# Patient Record
Sex: Female | Born: 1985
Health system: Southern US, Community
[De-identification: ages and names within clinical notes are randomized; demographics above are authoritative.]

## PROBLEM LIST (undated history)

## (undated) ENCOUNTER — Inpatient Hospital Stay (HOSPITAL_COMMUNITY): Payer: Medicaid Other | Admitting: Obstetrics & Gynecology

## (undated) DIAGNOSIS — K589 Irritable bowel syndrome without diarrhea: Secondary | ICD-10-CM

## (undated) DIAGNOSIS — Z531 Procedure and treatment not carried out because of patient's decision for reasons of belief and group pressure: Secondary | ICD-10-CM

## (undated) DIAGNOSIS — IMO0001 Reserved for inherently not codable concepts without codable children: Secondary | ICD-10-CM

## (undated) DIAGNOSIS — L732 Hidradenitis suppurativa: Secondary | ICD-10-CM

## (undated) DIAGNOSIS — T4145XA Adverse effect of unspecified anesthetic, initial encounter: Secondary | ICD-10-CM

## (undated) DIAGNOSIS — F329 Major depressive disorder, single episode, unspecified: Secondary | ICD-10-CM

## (undated) DIAGNOSIS — F419 Anxiety disorder, unspecified: Secondary | ICD-10-CM

## (undated) DIAGNOSIS — F32A Depression, unspecified: Secondary | ICD-10-CM

## (undated) DIAGNOSIS — L0232 Furuncle of buttock: Secondary | ICD-10-CM

## (undated) DIAGNOSIS — R112 Nausea with vomiting, unspecified: Secondary | ICD-10-CM

## (undated) DIAGNOSIS — D649 Anemia, unspecified: Secondary | ICD-10-CM

## (undated) DIAGNOSIS — Z9889 Other specified postprocedural states: Secondary | ICD-10-CM

## (undated) DIAGNOSIS — T8859XA Other complications of anesthesia, initial encounter: Secondary | ICD-10-CM

## (undated) HISTORY — DX: Hidradenitis suppurativa: L73.2

## (undated) HISTORY — PX: OTHER SURGICAL HISTORY: SHX169

## (undated) HISTORY — DX: Anemia, unspecified: D64.9

## (undated) HISTORY — PX: NASAL ENDOSCOPY: SHX286

## (undated) SURGERY — INCISION AND DRAINAGE, ABSCESS
Anesthesia: General | Laterality: Bilateral

---

## 2004-06-03 ENCOUNTER — Inpatient Hospital Stay (HOSPITAL_COMMUNITY): Admission: AD | Admit: 2004-06-03 | Discharge: 2004-06-03 | Payer: Self-pay | Admitting: Obstetrics and Gynecology

## 2004-06-05 ENCOUNTER — Inpatient Hospital Stay (HOSPITAL_COMMUNITY): Admission: AD | Admit: 2004-06-05 | Discharge: 2004-06-05 | Payer: Self-pay | Admitting: Obstetrics and Gynecology

## 2004-06-27 ENCOUNTER — Inpatient Hospital Stay (HOSPITAL_COMMUNITY): Admission: AD | Admit: 2004-06-27 | Discharge: 2004-06-28 | Payer: Self-pay | Admitting: Obstetrics and Gynecology

## 2004-06-28 ENCOUNTER — Inpatient Hospital Stay (HOSPITAL_COMMUNITY): Admission: AD | Admit: 2004-06-28 | Discharge: 2004-07-01 | Payer: Self-pay | Admitting: Obstetrics and Gynecology

## 2004-08-11 ENCOUNTER — Other Ambulatory Visit: Admission: RE | Admit: 2004-08-11 | Discharge: 2004-08-11 | Payer: Self-pay | Admitting: Obstetrics and Gynecology

## 2004-08-12 ENCOUNTER — Encounter: Admission: RE | Admit: 2004-08-12 | Discharge: 2004-08-12 | Payer: Self-pay | Admitting: Obstetrics and Gynecology

## 2004-09-04 ENCOUNTER — Encounter: Admission: RE | Admit: 2004-09-04 | Discharge: 2004-09-04 | Payer: Self-pay | Admitting: Obstetrics and Gynecology

## 2004-09-18 ENCOUNTER — Encounter: Admission: RE | Admit: 2004-09-18 | Discharge: 2004-09-18 | Payer: Self-pay | Admitting: Obstetrics and Gynecology

## 2005-05-29 ENCOUNTER — Other Ambulatory Visit: Admission: RE | Admit: 2005-05-29 | Discharge: 2005-05-29 | Payer: Self-pay | Admitting: Obstetrics and Gynecology

## 2009-06-15 HISTORY — PX: WISDOM TOOTH EXTRACTION: SHX21

## 2009-11-29 ENCOUNTER — Ambulatory Visit: Payer: Self-pay | Admitting: Diagnostic Radiology

## 2009-11-29 ENCOUNTER — Emergency Department (HOSPITAL_BASED_OUTPATIENT_CLINIC_OR_DEPARTMENT_OTHER): Admission: EM | Admit: 2009-11-29 | Discharge: 2009-11-29 | Payer: Self-pay | Admitting: Emergency Medicine

## 2009-12-01 ENCOUNTER — Ambulatory Visit: Payer: Self-pay | Admitting: Nurse Practitioner

## 2009-12-01 ENCOUNTER — Inpatient Hospital Stay (HOSPITAL_COMMUNITY): Admission: AD | Admit: 2009-12-01 | Discharge: 2009-12-02 | Payer: Self-pay | Admitting: Obstetrics & Gynecology

## 2009-12-22 ENCOUNTER — Emergency Department (HOSPITAL_BASED_OUTPATIENT_CLINIC_OR_DEPARTMENT_OTHER): Admission: EM | Admit: 2009-12-22 | Discharge: 2009-12-22 | Payer: Self-pay | Admitting: Emergency Medicine

## 2010-08-31 LAB — CBC
HCT: 40.9 % (ref 36.0–46.0)
MCV: 96.3 fL (ref 78.0–100.0)
Platelets: 239 10*3/uL (ref 150–400)
RDW: 14 % (ref 11.5–15.5)

## 2010-08-31 LAB — URINALYSIS, ROUTINE W REFLEX MICROSCOPIC
Bilirubin Urine: NEGATIVE
Ketones, ur: NEGATIVE mg/dL
Protein, ur: NEGATIVE mg/dL
Specific Gravity, Urine: 1.018 (ref 1.005–1.030)
Urobilinogen, UA: 1 mg/dL (ref 0.0–1.0)
pH: 7 (ref 5.0–8.0)

## 2010-08-31 LAB — GC/CHLAMYDIA PROBE AMP, GENITAL: GC Probe Amp, Genital: NEGATIVE

## 2010-08-31 LAB — WET PREP, GENITAL: Clue Cells Wet Prep HPF POC: NONE SEEN

## 2010-08-31 LAB — HCG, QUANTITATIVE, PREGNANCY: hCG, Beta Chain, Quant, S: 31 m[IU]/mL — ABNORMAL HIGH (ref <5)

## 2010-08-31 LAB — URINE MICROSCOPIC-ADD ON

## 2010-10-31 NOTE — Discharge Summary (Signed)
Lisa Guerrero, Lisa Guerrero           ACCOUNT NO.:  [id number]   MEDICAL RECORD NO.:  [id number]          PATIENT TYPE:  INP   LOCATION:  9124                          FACILITY:  [facility]   PHYSICIAN:  Zenaida Niece, M.D.DATE OF BIRTH:  1986-02-19   DATE OF ADMISSION:  06/28/2004  DATE OF DISCHARGE:  07/01/2004                                 DISCHARGE SUMMARY   ADMISSION DIAGNOSIS:  Intrauterine pregnancy at 38 weeks with rupture of  membranes and Group B Streptococcus carrier.   DISCHARGE DIAGNOSES:  1.  Intrauterine pregnancy at 38 weeks with rupture of membranes and Group B      Streptococcus carrier.  2.  Prolonged rupture of membranes.   PROCEDURE:  On January 15, she had a vacuum-assisted vaginal delivery.   HISTORY OF PRESENT ILLNESS:  This is a 25 year old, African-American female,  G1, P0 with an EGA of [redacted] weeks who presents with complaints of spontaneous  rupture of membranes at approximately 0330 hours on January 14, with clear  fluid.  She had occasional contractions, no bleeding and slightly decreased  fetal movement.  She had a reactive nonstress test on the evening of January  13, for decreased fetal movement and on evaluation at that time, she was 3  cm dilated.  On admission currently, rupture of membranes were confirmed and  she was still 3 cm dilated with no change from the previous night.   PRENATAL COURSE:  She transferred to our group at approximately 32 weeks and  care was essentially uncomplicated.   PRENATAL LABORATORY DATA:  Blood type O positive with a negative antibody  screen.  RPR nonreactive.  Rubella immune.  Hepatitis B surface antigen.  HIV negative.  Gonorrhea and Chlamydia negative.  Triple screen normal.  One-  hour Glucola 139 and Group B Streptococcus is positive.   PAST GYNECOLOGIC HISTORY:  Significant for history of Chlamydia in March  2005, treated with negative test of cure.   PHYSICAL EXAMINATION:  VITAL SIGNS:  Afebrile with stable  vital signs.  Fetal heart tracing was reactive with contractions every 4-5 minutes on  Pitocin.  ABDOMEN:  Gravid, nontender with an estimated fetal weight of 7.5 pounds.  PELVIC:  Cervix was 3, 60, -1 to -2 with a vertex presentation and adequate  pelvis.   HOSPITAL COURSE:  The patient was admitted and started on Pitocin for  augmentation and penicillin for Group B Streptococcus prophylaxis.  She had  a very prolonged, latent labor.  She did not enter active labor until the  morning of January 15.  She then gradually progressed to complete and pushed  well.  On the afternoon of January 15, she had a vacuum-assisted vaginal  delivery for decreased expulsive efforts.  She eventually actually pushed  the vertex out by herself and had a vaginal delivery of a viable female infant  with Apgar's of 7 and 9 that weighed 7 pounds 3 ounces.  The placenta  delivered spontaneous and was intact.  She had two superficial labial  abrasions which were hemostatic and not repaired.  Fundus was firm with  massage and she was given  IM Methergine due to prolonged Pitocin exposure.  Estimated blood loss was 500 cc.  The patient did have a temperature to  100.8 just prior to delivery, but this was observed.  Postpartum, she had no  significant complications.  She had normal bleeding and remained afebrile.  Predelivery hemoglobin was 12.2, postdelivery 11.0.  On postpartum day #2,  she was felt to be stable enough for discharge home.   DIET:  Regular diet.   ACTIVITY:  Pelvic rest.   FOLLOW UP:  Follow up in 6 weeks.   DISCHARGE MEDICATIONS:  1.  Over-the-counter ibuprofen as needed.  2.  Percocet #30 one to two p.o. q.4-6h. p.r.n. pain.   SPECIAL INSTRUCTIONS:  She was given our discharge pamphlet.      TDM/MEDQ  D:  07/01/2004  T:  07/01/2004  Job:  [id number]

## 2011-02-08 ENCOUNTER — Emergency Department (HOSPITAL_BASED_OUTPATIENT_CLINIC_OR_DEPARTMENT_OTHER)
Admission: EM | Admit: 2011-02-08 | Discharge: 2011-02-08 | Disposition: A | Discharge status: Home / Self Care | Payer: Medicaid Other | Attending: Emergency Medicine | Admitting: Emergency Medicine

## 2011-02-08 ENCOUNTER — Encounter: Payer: Self-pay | Admitting: Emergency Medicine

## 2011-02-08 DIAGNOSIS — N760 Acute vaginitis: Secondary | ICD-10-CM | POA: Insufficient documentation

## 2011-02-08 DIAGNOSIS — A499 Bacterial infection, unspecified: Secondary | ICD-10-CM | POA: Insufficient documentation

## 2011-02-08 DIAGNOSIS — B9689 Other specified bacterial agents as the cause of diseases classified elsewhere: Secondary | ICD-10-CM | POA: Insufficient documentation

## 2011-02-08 LAB — DIFFERENTIAL
Eosinophils Absolute: 0.1 10*3/uL (ref 0.0–0.7)
Eosinophils Relative: 1 % (ref 0–5)
Lymphocytes Relative: 37 % (ref 12–46)
Lymphs Abs: 2.1 10*3/uL (ref 0.7–4.0)
Monocytes Relative: 8 % (ref 3–12)
Neutro Abs: 3 10*3/uL (ref 1.7–7.7)
Neutrophils Relative %: 54 % (ref 43–77)

## 2011-02-08 LAB — URINALYSIS, ROUTINE W REFLEX MICROSCOPIC
Bilirubin Urine: NEGATIVE
Ketones, ur: NEGATIVE mg/dL
Leukocytes, UA: NEGATIVE
Protein, ur: NEGATIVE mg/dL
pH: 7 (ref 5.0–8.0)

## 2011-02-08 LAB — CBC
MCHC: 33.5 g/dL (ref 30.0–36.0)
Platelets: 220 10*3/uL (ref 150–400)

## 2011-02-08 LAB — BASIC METABOLIC PANEL
BUN: 5 mg/dL — ABNORMAL LOW (ref 6–23)
Calcium: 9.8 mg/dL (ref 8.4–10.5)
Creatinine, Ser: 0.6 mg/dL (ref 0.50–1.10)
GFR calc non Af Amer: >60 mL/min (ref >60)

## 2011-02-08 LAB — URINE MICROSCOPIC-ADD ON

## 2011-02-08 MED ORDER — HYDROCODONE-ACETAMINOPHEN 5-325 MG PO TABS
1.0000 | ORAL_TABLET | Freq: Once | ORAL | Status: AC
  Administered 2011-02-08: 1 via ORAL
  Filled 2011-02-08: qty 1

## 2011-02-08 MED ORDER — METRONIDAZOLE 500 MG PO TABS: 500.0000 mg | ORAL_TABLET | Freq: Two times a day (BID) | ORAL | Status: AC

## 2011-02-08 MED ORDER — HYDROCODONE-ACETAMINOPHEN 5-325 MG PO TABS: 1.0000 | ORAL_TABLET | ORAL | Status: AC | PRN

## 2011-02-08 NOTE — ED Notes (Signed)
Patient is resting comfortably.  Pt reports having a moderate amount of pain after pelvic.   Pt states she is pleased with the care she is receiving from Dr. Ignacia Palma.

## 2011-02-08 NOTE — ED Notes (Signed)
Pt reports BLQ pain, describes as cramping, since last pm. Also reports passing "tiny clots"

## 2011-02-08 NOTE — ED Notes (Signed)
Pt states she is having "contraction-like" pelvic pain.  Pt reports having vaginal spotting yesterday and blood in her urine today.  Pt states LMP was 01/16/11 and reports that she is not currently sexually active.  MD at bedside.

## 2011-02-08 NOTE — ED Provider Notes (Signed)
History     CSN: [id number] Arrival date & time: 02/08/2011  3:03 PM Scribed for Lisa Cooper III, MD, the patient was seen in room MH09/MH09. This chart was scribed by Katha Cabal. This patient's care was started at  3:36PM.   Chief Complaint  Patient presents with  . Abdominal Pain   HPI Lisa Guerrero is a 25 y.o. female who presents to the Emergency Department complaining of bilateral suprapubic abdominal pain described as "bad cramping" that waxes and wanes onset last night with associated vaginal bleeding with small clots.  Pt states pain is gradually getting worse, rated 10/10 at worse and 7/10 currently.  LKMP 8/3//2012, typically lasts for 4 days.  No relief from Ibuprofen.  Denies hx of DM, HTN.  Notes similar sx previously when she was younger and was given Naproxen given for menstrual cramps.     HPI ELEMENTS:  Location: suprapubic  Onset: last night Duration: gradually getting worse  Timing: waxing and waning Quality: "cramping" Severity: 10/10 at worse, currently 6/10 Modifying factors: No relief fromIbuprofen. Context: as above  Associated symptoms: vaginal bleeding   PAST MEDICAL HISTORY:  History reviewed. No pertinent past medical history.  PAST SURGICAL HISTORY:  Right axilla cyst removal.    MEDICATIONS:  Previous Medications   IBUPROFEN (ADVIL,MOTRIN) 200 MG TABLET    Take 200-800 mg by mouth every 6 (six) hours as needed. pain    NAPHAZOLINE (CLEAR EYES) 0.012 % OPHTHALMIC SOLUTION    Place 1 drop into both eyes 2 (two) times daily.       ALLERGIES:  Allergies as of 02/08/2011  . (No Known Allergies)     FAMILY HISTORY:  History reviewed. No pertinent family history.   SOCIAL HISTORY: History   Social History  . Marital Status: Single    Spouse Name: N/A    Number of Children: 2  . Years of Education: N/A   Social History Main Topics  . Smoking status: Current Everyday Smoker, 4-5 cigarettes a day  . Smokeless tobacco: None  .  Alcohol Use: Yes     social  . Drug Use: No  . Sexually Active: Not Currently    Birth Control/ Protection: None   Other Topics Concern  . None   Social History Narrative  . None    Review of Systems 10 Systems reviewed and are negative for acute change except as noted in the HPI.  Physical Exam  BP 105/72  Pulse 69  Temp(Src) 98.3 F (36.8 C) (Oral)  Resp 16  SpO2 100%  LMP 01/16/2011  Physical Exam  Nursing note and vitals reviewed. Constitutional: She is oriented to person, place, and time. She appears well-developed and well-nourished.  HENT:  Head: Normocephalic and atraumatic.  Right Ear: Tympanic membrane normal.  Left Ear: Tympanic membrane normal.  Mouth/Throat: Oropharynx is clear and moist.  Eyes: EOM are normal. Pupils are equal, round, and reactive to light.  Neck: Neck supple.  Cardiovascular: Normal rate, regular rhythm and normal heart sounds.   No murmur heard. Pulmonary/Chest: Effort normal and breath sounds normal. She has no wheezes.  Abdominal: Soft. Bowel sounds are normal. There is no tenderness.  Genitourinary:       Patient has dark red blood in the vagina. The uterus is small and nontender there is no adnexal mass or tenderness GC chlamydia and wet prep specimens were obtained.  Musculoskeletal: Normal range of motion. She exhibits no tenderness (no tenderness with palpation).  Neurological: She is alert and  oriented to person, place, and time.  Skin: Skin is warm and dry.  Psychiatric: She has a normal mood and affect. Her behavior is normal.    ED Course  Procedures  OTHER DATA REVIEWED: Nursing notes, vital signs, and past medical records reviewed.    DIAGNOSTIC STUDIES: Oxygen Saturation is 100% on room air, normal by my interpretation.     LABS / RADIOLOGY: Results for orders placed during the hospital encounter of 02/08/11  URINALYSIS, ROUTINE W REFLEX MICROSCOPIC      Component Value Range   Color, Urine YELLOW  YELLOW     Appearance CLOUDY (*) CLEAR    Specific Gravity, Urine 1.019  1.005 - 1.030    pH 7.0  5.0 - 8.0    Glucose, UA NEGATIVE  NEGATIVE (mg/dL)   Hgb urine dipstick LARGE (*) NEGATIVE    Bilirubin Urine NEGATIVE  NEGATIVE    Ketones, ur NEGATIVE  NEGATIVE (mg/dL)   Protein, ur NEGATIVE  NEGATIVE (mg/dL)   Urobilinogen, UA 0.2  0.0 - 1.0 (mg/dL)   Nitrite NEGATIVE  NEGATIVE    Leukocytes, UA NEGATIVE  NEGATIVE   CBC      Component Value Range   WBC 5.6  4.0 - 10.5 (K/uL)   RBC 4.48  3.87 - 5.11 (MIL/uL)   Hemoglobin 13.8  12.0 - 15.0 (g/dL)   HCT 14.7  82.9 - 56.2 (%)   MCV 92.0  78.0 - 100.0 (fL)   MCH 30.8  26.0 - 34.0 (pg)   MCHC 33.5  30.0 - 36.0 (g/dL)   RDW 13.0  86.5 - 78.4 (%)   Platelets 220  150 - 400 (K/uL)  DIFFERENTIAL      Component Value Range   Neutrophils Relative PENDING  43 - 77 (%)   Neutro Abs PENDING  1.7 - 7.7 (K/uL)   Band Neutrophils PENDING  0 - 10 (%)   Lymphocytes Relative PENDING  12 - 46 (%)   Lymphs Abs PENDING  0.7 - 4.0 (K/uL)   Monocytes Relative PENDING  3 - 12 (%)   Monocytes Absolute PENDING  0.1 - 1.0 (K/uL)   Eosinophils Relative PENDING  0 - 5 (%)   Eosinophils Absolute PENDING  0.0 - 0.7 (K/uL)   Basophils Relative PENDING  0 - 1 (%)   Basophils Absolute PENDING  0.0 - 0.1 (K/uL)   WBC Morphology PENDING     RBC Morphology PENDING     Smear Review PENDING     nRBC PENDING  0 (/100 WBC)   Metamyelocytes Relative PENDING     Myelocytes PENDING     Promyelocytes Absolute PENDING     Blasts PENDING    BASIC METABOLIC PANEL      Component Value Range   Sodium 138  135 - 145 (mEq/L)   Potassium 3.9  3.5 - 5.1 (mEq/L)   Chloride 104  96 - 112 (mEq/L)   CO2 26  19 - 32 (mEq/L)   Glucose, Bld 99  70 - 99 (mg/dL)   BUN 5 (*) 6 - 23 (mg/dL)   Creatinine, Ser 6.96  0.50 - 1.10 (mg/dL)   Calcium 9.8  8.4 - 29.5 (mg/dL)   GFR calc non Af Amer >60  >60 (mL/min)   GFR calc Af Amer >60  >60 (mL/min)  WET PREP, GENITAL      Component Value  Range   Yeast, Wet Prep NONE SEEN  NONE SEEN    Trich, Wet Prep NONE SEEN  NONE SEEN    Clue Cells, Wet Prep FEW (*) NONE SEEN    WBC, Wet Prep HPF POC MODERATE (*) NONE SEEN   PREGNANCY, URINE      Component Value Range   Preg Test, Ur NEGATIVE    URINE MICROSCOPIC-ADD ON      Component Value Range   Squamous Epithelial / LPF MANY (*) RARE    WBC, UA 0-2  <3 (WBC/hpf)   RBC / HPF 0-2  <3 (RBC/hpf)   Bacteria, UA MANY (*) RARE    Urine-Other MUCOUS PRESENT       No results found.   ED COURSE / COORDINATION OF CARE: Orders Placed This Encounter  Procedures  . Wet prep, genital  . Urinalysis with microscopic  . CBC  . Differential  . Basic metabolic panel  . GC/chlamydia probe amp, genital  . Pregnancy, urine  . Urine microscopic-add on  . Pelvic cart    MDM:  3:45 PE complete, order urine and blood tests.  Pain Control.   5:43 PM UA negative.  Wet prep positive for clue cells.  UPG negative.  Waiting for CBC and BMET results.    DDx  Appendicitis, Pregnancy, Ovarian Cyst, Infection   IMPRESSION: Diagnoses that have been ruled out:  Diagnoses that are still under consideration:  Final diagnoses:    PLAN:   The patient is to return the emergency department if there is any worsening of symptoms. I have reviewed the discharge instructions with the parent.     CONDITION ON DISCHARGE: Good   MEDICATIONS GIVEN IN THE E.D.  Medications  HYDROcodone-acetaminophen (NORCO) 5-325 MG per tablet 1 tablet           DISCHARGE MEDICATIONS: New Prescriptions   No medications on file   Lab tests suggest bacterial vaginosis. Prescription for metronidazole 500 mg twice a day x7 days. She was also prescribed hydrocodone acetaminophen every 4 hours when necessary pain. Her pregnancy test was negative and the patient was informed of this.  I personally performed the services described in this documentation, which was scribed in my presence. The recorded information  has been reviewed and considered. Osvaldo Human, MD    Lisa Cooper III, MD 02/08/11 [phone]

## 2011-02-08 NOTE — ED Notes (Signed)
Patient is resting comfortably.MD at side care plan reviewed

## 2011-02-08 NOTE — ED Notes (Signed)
Pt verbalizes care plan and use of anitibiotics and pain medication

## 2011-02-09 LAB — GC/CHLAMYDIA PROBE AMP, GENITAL
Chlamydia, DNA Probe: NEGATIVE
GC Probe Amp, Genital: NEGATIVE

## 2011-05-03 ENCOUNTER — Encounter (HOSPITAL_BASED_OUTPATIENT_CLINIC_OR_DEPARTMENT_OTHER): Payer: Self-pay | Admitting: *Deleted

## 2011-05-03 ENCOUNTER — Emergency Department (HOSPITAL_BASED_OUTPATIENT_CLINIC_OR_DEPARTMENT_OTHER)
Admission: EM | Admit: 2011-05-03 | Discharge: 2011-05-03 | Discharge status: Against Medical Advice | Payer: Medicaid Other | Attending: Emergency Medicine | Admitting: Emergency Medicine

## 2011-05-03 DIAGNOSIS — K920 Hematemesis: Secondary | ICD-10-CM | POA: Insufficient documentation

## 2011-05-03 NOTE — ED Notes (Signed)
Pt states she woke up this am and was vomiting blood. Went to [facility]. Labs done. Pt given fluids and CXR was done "Didn't find anything". Pt thinks she has an ulcer and wants a second opinion. Now c/o low abd pain. No vomiting since leaving the hospital.

## 2011-07-23 ENCOUNTER — Emergency Department (HOSPITAL_BASED_OUTPATIENT_CLINIC_OR_DEPARTMENT_OTHER)
Admission: EM | Admit: 2011-07-23 | Discharge: 2011-07-23 | Disposition: A | Discharge status: Home / Self Care | Payer: Medicaid Other | Attending: Emergency Medicine | Admitting: Emergency Medicine

## 2011-07-23 ENCOUNTER — Encounter (INDEPENDENT_AMBULATORY_CARE_PROVIDER_SITE_OTHER): Payer: Self-pay | Admitting: General Surgery

## 2011-07-23 ENCOUNTER — Encounter (HOSPITAL_BASED_OUTPATIENT_CLINIC_OR_DEPARTMENT_OTHER): Payer: Self-pay | Admitting: *Deleted

## 2011-07-23 DIAGNOSIS — F17200 Nicotine dependence, unspecified, uncomplicated: Secondary | ICD-10-CM | POA: Insufficient documentation

## 2011-07-23 DIAGNOSIS — L0291 Cutaneous abscess, unspecified: Secondary | ICD-10-CM

## 2011-07-23 DIAGNOSIS — IMO0002 Reserved for concepts with insufficient information to code with codable children: Secondary | ICD-10-CM | POA: Insufficient documentation

## 2011-07-23 MED ORDER — LIDOCAINE-EPINEPHRINE 2 %-1:100000 IJ SOLN
20.0000 mL | Freq: Once | INTRAMUSCULAR | Status: AC
  Administered 2011-07-23: 1 mL via INTRADERMAL
  Filled 2011-07-23: qty 1

## 2011-07-23 MED ORDER — OXYCODONE-ACETAMINOPHEN 5-325 MG PO TABS: 1.0000 | ORAL_TABLET | Freq: Four times a day (QID) | ORAL | Status: AC | PRN

## 2011-07-23 MED ORDER — MORPHINE SULFATE 4 MG/ML IJ SOLN
4.0000 mg | Freq: Once | INTRAMUSCULAR | Status: AC
  Administered 2011-07-23: 4 mg via INTRAVENOUS
  Filled 2011-07-23: qty 1

## 2011-07-23 NOTE — ED Provider Notes (Addendum)
History     CSN: [id number]  Arrival date & time 07/23/11  1041   First MD Initiated Contact with Patient 07/23/11 1050      Chief Complaint  Patient presents with  . Abscess    (Consider location/radiation/quality/duration/timing/severity/associated sxs/prior treatment) Patient is a 26 y.o. female presenting with abscess. The history is provided by the patient.  Abscess  This is a recurrent problem. The current episode started less than one week ago. The onset was gradual. The problem has been gradually worsening. Affected Location: left axilla. The abscess is characterized by redness and painfulness. Pertinent negatives include no fever, no diarrhea and no vomiting.    History reviewed. No pertinent past medical history. except prior abscesses requiring incision and drainage  History reviewed. No pertinent past surgical history.  History reviewed. No pertinent family history.  History  Substance Use Topics  . Smoking status: Current Everyday Smoker  . Smokeless tobacco: Not on file  . Alcohol Use: Yes     social    OB History    Grav Para Term Preterm Abortions TAB SAB Ect Mult Living                  Review of Systems  Constitutional: Negative for fever.  Gastrointestinal: Negative for vomiting and diarrhea.  All other systems reviewed and are negative.    Allergies  Review of patient's allergies indicates no known allergies.  Home Medications   Current Outpatient Rx  Name Route Sig Dispense Refill  . DOXYCYCLINE HYCLATE 100 MG PO CPEP Oral Take 100 mg by mouth 2 (two) times daily.      Marland Kitchen MEDROXYPROGESTERONE ACETATE 150 MG/ML IM SUSP Intramuscular Inject 150 mg into the muscle every 3 (three) months.      Marland Kitchen METRONIDAZOLE 500 MG PO TABS Oral Take 500 mg by mouth 2 (two) times daily.        BP 134/77  Pulse 107  Temp(Src) 97.7 F (36.5 C) (Oral)  Resp 20  Ht 5\' 5"  (1.651 m)  Wt 146 lb (66.225 kg)  BMI 24.30 kg/m2  SpO2 100%  LMP  06/26/2011  Physical Exam  Nursing note and vitals reviewed. Constitutional: She appears well-developed and well-nourished. No distress.  HENT:  Head: Normocephalic and atraumatic.  Right Ear: External ear normal.  Left Ear: External ear normal.  Eyes: Conjunctivae are normal. Right eye exhibits no discharge. Left eye exhibits no discharge. No scleral icterus.  Neck: Neck supple. No tracheal deviation present.  Cardiovascular: Normal rate.   Pulmonary/Chest: Effort normal. No stridor. No respiratory distress.  Musculoskeletal: She exhibits no edema.       Left axillary region with erythema, fluctuance and induration, well healed surgical scars from prior treatments  Neurological: She is alert. Cranial nerve deficit: no gross deficits.  Skin: Skin is warm and dry. No rash noted.  Psychiatric: She has a normal mood and affect.    ED Course  INCISION AND DRAINAGE Performed by: Linwood Dibbles R Authorized by: Linwood Dibbles R Consent: Verbal consent obtained. Written consent not obtained. Risks and benefits: risks, benefits and alternatives were discussed Consent given by: patient Time out: Immediately prior to procedure a "time out" was called to verify the correct patient, procedure, equipment, support staff and site/side marked as required. Type: abscess Body area: upper extremity (left axilla) Anesthesia: local infiltration Local anesthetic: lidocaine 2% with epinephrine Anesthetic total: 10 ml Patient sedated: no Risk factor: underlying major vessel and underlying major nerve Scalpel size: 11 Incision  type: single straight Complexity: complex Drainage: purulent Drainage amount: moderate Wound treatment: wound left open Packing material: 1/4 in iodoform gauze Patient tolerance: Patient tolerated the procedure well with no immediate complications.   (including critical care time)  Medications  morphine 4 MG/ML injection 4 mg (4 mg Intravenous Given 07/23/11 1122)   lidocaine-EPINEPHrine (XYLOCAINE W/EPI) 2 %-1:100000 (with pres) injection 20 mL (1 mL Intradermal Given 07/23/11 1121)    Labs Reviewed - No data to display No results found.    MDM  The procedure was complicated by the fact the patient has had multiple incision and drainage procedures with scar tissue.  The abscess pocket was located.  Irrigated and probed.  She most likely has hidradenitis suppurativa.  I will refer  her to general surgery for outpatient followup.    Cases discussed with Dr Harlon Flor in order to arrange close follow up.    Celene Kras, MD 07/23/11 1214  Celene Kras, MD 07/23/11 1226

## 2011-07-23 NOTE — ED Notes (Signed)
Pt amb to room 4 with quick steady gait in nad. Pt reports abcess to left axilla x Sunday, denies any fevers or other c/o, pt states she has hx of the same.

## 2011-07-24 ENCOUNTER — Ambulatory Visit (INDEPENDENT_AMBULATORY_CARE_PROVIDER_SITE_OTHER): Payer: Medicaid Other | Admitting: General Surgery

## 2011-07-24 ENCOUNTER — Encounter (INDEPENDENT_AMBULATORY_CARE_PROVIDER_SITE_OTHER): Payer: Self-pay | Admitting: General Surgery

## 2011-07-24 VITALS — BP 134/80 | HR 100 | Temp 97.8°F | Resp 16 | Ht 65.0 in | Wt 143.2 lb

## 2011-07-24 DIAGNOSIS — L02412 Cutaneous abscess of left axilla: Secondary | ICD-10-CM

## 2011-07-24 DIAGNOSIS — IMO0002 Reserved for concepts with insufficient information to code with codable children: Secondary | ICD-10-CM

## 2011-07-24 NOTE — Progress Notes (Signed)
Patient ID: Lisa Guerrero, female   DOB: 1986/04/14, 26 y.o.   MRN: [id number]  Chief Complaint  Patient presents with  . Abscess    lt axillary abscess    HPI Lisa Guerrero is a 26 y.o. female.  She is referred by Dr. Iantha Fallen, [facility], for evaluation of a left axillary abscess.  The patient states that she has been having bilateral axillary abscess since 2006. Presumably, this is due to hidradenitis suppurativa. When she saw Dr. Lynelle Doctor yesterday, he performed an incision drainage and packed the wound with iodoform gauze. She has a prescription for doxycycline which she states that she has fillled. She was referred here for surgical followup.  She is otherwise fairly healthy and doesn't really have any medical problems. She comes to the office with an infant today which she states is her child. She is in no distress. Temp 97.8. HPI  Past Medical History  Diagnosis Date  . Hidradenitis     CHRONIC  . Anemia     Past Surgical History  Procedure Date  . Drainage axillary abscess     Family History  Problem Relation Age of Onset  . Heart disease Maternal Grandmother     Social History History  Substance Use Topics  . Smoking status: Current Everyday Smoker -- 0.2 packs/day  . Smokeless tobacco: Not on file  . Alcohol Use: Yes     social    No Known Allergies  Current Outpatient Prescriptions  Medication Sig Dispense Refill  . doxycycline (DORYX) 100 MG DR capsule Take 100 mg by mouth 2 (two) times daily.        . metroNIDAZOLE (FLAGYL) 500 MG tablet Take 500 mg by mouth 2 (two) times daily.        Marland Kitchen oxyCODONE-acetaminophen (PERCOCET) 5-325 MG per tablet Take 1-2 tablets by mouth every 6 (six) hours as needed for pain.  16 tablet  0    Review of Systems Review of Systems 12 system review of systems is performed and is negative except as described above. Blood pressure 134/80, pulse 100, temperature 97.8 F (36.6 C), temperature source  Temporal, resp. rate 16, height 5\' 5"  (1.651 m), weight 143 lb 3.2 oz (64.955 kg), last menstrual period 06/26/2011.  Physical Exam Physical Exam  Constitutional: She appears well-developed and well-nourished. No distress.  Musculoskeletal: Normal range of motion. She exhibits no edema and no tenderness.  Neurological: She exhibits normal muscle tone. Coordination normal.  Skin: Skin is warm and dry. No rash noted. She is not diaphoretic. No erythema. No pallor.       There is a very localized abscess in the left axilla with a 5 mm opening with iodoform gauze present. I removed the iodoform gauze. It appeared to be a well-drained unilocular abscess. There is almost no cellulitis but it is very tender. The wound was redressed.  Psychiatric: She has a normal mood and affect. Her behavior is normal. Thought content normal.    Data Reviewed I reviewed the ER note from [facility] to dated July 22, 2010. Assessment    Eft axillary abscess,.  Suspect chronic hidradenitis suppurativa  Current every day smoker.    Plan    Continue doxycycline for 14 days. Shower twice daily and keep dry gauze bandage on Anticipate complete wound healing in 3 weeks. Patient instructed to call if this does not occur. She asked about definitive surgical intervention, and I discussed the complete excision of a pigmented  areas the skin grafts. We refer her to a plastic surgeon in the future if that became necessary   [facility] Derrell Lolling, M.D., [facility] General and Minimally invasive Surgery Breast and Colorectal Surgery Office:   [phone] Pager:   [phone]         Ernestene Mention 07/24/2011, 3:02 PM

## 2011-07-24 NOTE — Patient Instructions (Signed)
The abscess in your left axilla appears to be draining adequately. There does not appear to be any indication for further surgery at this time. I have instructed you to take the doxycycline antibiotic for 14 days. If you run out before then please call us and  we will call him further antibiotics for you.  If this area does not heal in 3 weeks, return to see [place] for further evaluation.  If you want to be evaluated for definitive surgery for your recurrent infections, that will require a referral to a plastic surgeon.

## 2011-11-10 ENCOUNTER — Encounter (HOSPITAL_BASED_OUTPATIENT_CLINIC_OR_DEPARTMENT_OTHER): Payer: Self-pay | Admitting: *Deleted

## 2011-11-10 ENCOUNTER — Emergency Department (HOSPITAL_BASED_OUTPATIENT_CLINIC_OR_DEPARTMENT_OTHER)
Admission: EM | Admit: 2011-11-10 | Discharge: 2011-11-10 | Disposition: A | Discharge status: Home / Self Care | Payer: Medicaid Other | Source: Home / Self Care

## 2011-11-10 DIAGNOSIS — IMO0002 Reserved for concepts with insufficient information to code with codable children: Secondary | ICD-10-CM | POA: Insufficient documentation

## 2011-11-10 HISTORY — DX: Irritable bowel syndrome, unspecified: K58.9

## 2011-11-10 NOTE — ED Notes (Signed)
Abscess in her left axilla x 3 days. It has opened and drained but she is still having pain at the site.

## 2011-11-10 NOTE — ED Notes (Signed)
Pt not visualized by this RN 

## 2011-11-10 NOTE — ED Notes (Signed)
No answer when called in w/a to come back to tx room. She told registration she was leaving and would return tomorrow.

## 2011-11-13 ENCOUNTER — Emergency Department (HOSPITAL_COMMUNITY): Admission: EM | Admit: 2011-11-13 | Payer: Medicaid Other | Source: Home / Self Care

## 2011-11-13 ENCOUNTER — Encounter (HOSPITAL_COMMUNITY): Payer: Self-pay | Admitting: *Deleted

## 2011-11-13 ENCOUNTER — Ambulatory Visit (INDEPENDENT_AMBULATORY_CARE_PROVIDER_SITE_OTHER): Payer: Medicaid Other | Admitting: Surgery

## 2011-11-13 ENCOUNTER — Encounter (INDEPENDENT_AMBULATORY_CARE_PROVIDER_SITE_OTHER): Payer: Self-pay | Admitting: Surgery

## 2011-11-13 ENCOUNTER — Inpatient Hospital Stay (HOSPITAL_COMMUNITY)
Admission: AD | Admit: 2011-11-13 | Discharge: 2011-11-17 | DRG: 603 | Disposition: A | Discharge status: Home / Self Care | Payer: Medicaid Other | Source: Ambulatory Visit | Attending: General Surgery | Admitting: General Surgery

## 2011-11-13 ENCOUNTER — Encounter (INDEPENDENT_AMBULATORY_CARE_PROVIDER_SITE_OTHER): Payer: Medicaid Other | Admitting: Surgery

## 2011-11-13 VITALS — BP 110/70 | HR 68 | Temp 98.0°F | Resp 14 | Ht 65.0 in | Wt 144.4 lb

## 2011-11-13 DIAGNOSIS — L732 Hidradenitis suppurativa: Secondary | ICD-10-CM

## 2011-11-13 DIAGNOSIS — F411 Generalized anxiety disorder: Secondary | ICD-10-CM | POA: Diagnosis not present

## 2011-11-13 DIAGNOSIS — F17200 Nicotine dependence, unspecified, uncomplicated: Secondary | ICD-10-CM | POA: Diagnosis present

## 2011-11-13 DIAGNOSIS — R112 Nausea with vomiting, unspecified: Secondary | ICD-10-CM | POA: Diagnosis not present

## 2011-11-13 DIAGNOSIS — L02419 Cutaneous abscess of limb, unspecified: Secondary | ICD-10-CM | POA: Insufficient documentation

## 2011-11-13 DIAGNOSIS — K589 Irritable bowel syndrome without diarrhea: Secondary | ICD-10-CM | POA: Insufficient documentation

## 2011-11-13 DIAGNOSIS — IMO0002 Reserved for concepts with insufficient information to code with codable children: Secondary | ICD-10-CM

## 2011-11-13 HISTORY — DX: Hidradenitis suppurativa: L73.2

## 2011-11-13 LAB — CBC
MCH: 30.5 pg (ref 26.0–34.0)
MCHC: 32.6 g/dL (ref 30.0–36.0)
Platelets: 304 10*3/uL (ref 150–400)
RBC: 3.94 MIL/uL (ref 3.87–5.11)
RDW: 13.2 % (ref 11.5–15.5)

## 2011-11-13 LAB — CREATININE, SERUM: Creatinine, Ser: 0.72 mg/dL (ref 0.50–1.10)

## 2011-11-13 MED ORDER — ONDANSETRON HCL 4 MG/2ML IJ SOLN
4.0000 mg | Freq: Four times a day (QID) | INTRAMUSCULAR | Status: DC | PRN
  Administered 2011-11-14 – 2011-11-15 (×2): 4 mg via INTRAVENOUS
  Filled 2011-11-13: qty 4
  Filled 2011-11-13 (×2): qty 2

## 2011-11-13 MED ORDER — LACTATED RINGERS IV SOLN
INTRAVENOUS | Status: DC
  Administered 2011-11-13: 22:00:00 via INTRAVENOUS

## 2011-11-13 MED ORDER — DIPHENHYDRAMINE HCL 50 MG/ML IJ SOLN
12.5000 mg | Freq: Four times a day (QID) | INTRAMUSCULAR | Status: DC | PRN
  Administered 2011-11-16: 25 mg via INTRAVENOUS

## 2011-11-13 MED ORDER — MAGNESIUM HYDROXIDE 400 MG/5ML PO SUSP: 30.0000 mL | Freq: Two times a day (BID) | ORAL | Status: DC | PRN

## 2011-11-13 MED ORDER — ACETAMINOPHEN 650 MG RE SUPP: 650.0000 mg | Freq: Four times a day (QID) | RECTAL | Status: DC | PRN

## 2011-11-13 MED ORDER — OXYCODONE HCL 5 MG PO TABS
5.0000 mg | ORAL_TABLET | ORAL | Status: DC | PRN
  Administered 2011-11-15 – 2011-11-16 (×4): 10 mg via ORAL
  Filled 2011-11-13 (×4): qty 2

## 2011-11-13 MED ORDER — DIPHENHYDRAMINE HCL 12.5 MG/5ML PO ELIX: 12.5000 mg | ORAL_SOLUTION | Freq: Four times a day (QID) | ORAL | Status: DC | PRN

## 2011-11-13 MED ORDER — DIPHENHYDRAMINE HCL 50 MG/ML IJ SOLN
12.5000 mg | Freq: Four times a day (QID) | INTRAMUSCULAR | Status: DC | PRN
  Administered 2011-11-13: 12.5 mg via INTRAVENOUS
  Filled 2011-11-13 (×3): qty 1

## 2011-11-13 MED ORDER — CEFAZOLIN SODIUM-DEXTROSE 2-3 GM-% IV SOLR
2.0000 g | Freq: Three times a day (TID) | INTRAVENOUS | Status: DC
  Administered 2011-11-13 – 2011-11-15 (×6): 2 g via INTRAVENOUS
  Filled 2011-11-13 (×7): qty 50

## 2011-11-13 MED ORDER — MAGIC MOUTHWASH
15.0000 mL | Freq: Four times a day (QID) | ORAL | Status: DC | PRN
  Filled 2011-11-13: qty 15

## 2011-11-13 MED ORDER — ALUM & MAG HYDROXIDE-SIMETH 200-200-20 MG/5ML PO SUSP: 30.0000 mL | Freq: Four times a day (QID) | ORAL | Status: DC | PRN

## 2011-11-13 MED ORDER — ACETAMINOPHEN 325 MG PO TABS
650.0000 mg | ORAL_TABLET | Freq: Four times a day (QID) | ORAL | Status: DC | PRN
  Administered 2011-11-16 – 2011-11-17 (×2): 650 mg via ORAL
  Filled 2011-11-13 (×2): qty 2

## 2011-11-13 MED ORDER — NAPROXEN 500 MG PO TABS
500.0000 mg | ORAL_TABLET | Freq: Two times a day (BID) | ORAL | Status: DC
  Filled 2011-11-13 (×3): qty 1

## 2011-11-13 MED ORDER — HYDROMORPHONE HCL PF 1 MG/ML IJ SOLN
0.5000 mg | INTRAMUSCULAR | Status: DC | PRN
  Administered 2011-11-13 – 2011-11-14 (×3): 1 mg via INTRAVENOUS
  Filled 2011-11-13 (×3): qty 1

## 2011-11-13 MED ORDER — LIP MEDEX EX OINT
1.0000 "application " | TOPICAL_OINTMENT | Freq: Two times a day (BID) | CUTANEOUS | Status: DC
  Administered 2011-11-13 – 2011-11-16 (×7): 1 via TOPICAL
  Filled 2011-11-13 (×2): qty 7

## 2011-11-13 NOTE — Progress Notes (Signed)
Subjective:     Patient ID: Lisa Guerrero, female   DOB: Lisa Guerrero, 26 y.o.   MRN: [id number]  HPI  Lisa Guerrero  Lisa Guerrero [id number]  Patient Care Team: Alm Bustard as PCP - General (Specialist) Shelly Rubenstein, MD as Consulting Physician (General Surgery)  This patient is a 26 y.o.female who presents today for surgical evaluation.   Reason for visit: Worsening pain and axilla. History of hydradenitis.  Patient is a young smoking female who's had problems with infections in both armpits. Has been drained in numerous times in the past. Was seen in the ER in February. Followup with Dr. Derrell Lolling. Infection was improving. He recommended consideration of elective wide excision. Felt plastic surgery would be a good option. She does not recall setting up an appointment with them.  She recalls many family members having problems with this as well. They only got better after the sweat glands were removed with surgery.  She noted that the left armpit recurrently drains. It is swollen and more tender. The right side has been very swollen. Starting to drain out. Very comfortable. She is frustrated. She feels like she has not been but clear to infection for the past 3 months. No hernia proximal right now. She was due to followup with [place] in a few weeks.   She wished to be seen today as she was worried since the tenderness & swelling in the right armpit has rapidly been getting worse  Patient Active Problem List  Diagnoses  . Hidradenitis suppurativa, chronic & recurrent  . IBS (irritable bowel syndrome)  . Axillary abscess, right 4x3cm  . Abscess of left axilla 3x2cm  . Current smoker    Past Medical History  Diagnosis Date  . Hidradenitis     CHRONIC  . Anemia   . IBS (irritable bowel syndrome)   . Hidradenitis suppurativa 11/13/2011    Past Surgical History  Procedure Date  . Drainage axillary abscess     History   Social History  . Marital Status: Single    Spouse  Name: N/A    Number of Children: N/A  . Years of Education: N/A   Occupational History  . Not on file.   Social History Main Topics  . Smoking status: Current Everyday Smoker -- 0.2 packs/day  . Smokeless tobacco: Not on file  . Alcohol Use: Yes     social  . Drug Use: No  . Sexually Active: Not Currently    Birth Control/ Protection: None   Other Topics Concern  . Not on file   Social History Narrative  . No narrative on file    Family History  Problem Relation Age of Onset  . Heart disease Maternal Grandmother     Current Outpatient Prescriptions  Medication Sig Dispense Refill  . acetaminophen-codeine (TYLENOL #3) 300-30 MG per tablet Take 1 tablet by mouth every 4 (four) hours as needed.      . Sulfamethoxazole-Trimethoprim (BACTRIM PO) Take by mouth.         No Known Allergies  BP 110/70  Pulse 68  Temp 98 F (36.7 C)  Resp 14  Ht 5\' 5"  (1.651 m)  Wt 144 lb 6.4 oz (65.499 kg)  BMI 24.03 kg/m2  No results found.  Review of Systems  Constitutional: Negative for fever, chills and diaphoresis.  HENT: Negative for ear pain, sore throat and trouble swallowing.   Eyes: Negative for photophobia and visual disturbance.  Respiratory: Negative for cough and choking.  Cardiovascular: Negative for chest pain and palpitations.  Gastrointestinal: Negative for nausea, vomiting, abdominal pain, diarrhea, constipation, anal bleeding and rectal pain.  Genitourinary: Negative for dysuria, frequency and difficulty urinating.  Musculoskeletal: Negative for myalgias and gait problem.  Skin: Positive for wound. Negative for color change, pallor and rash.  Neurological: Negative for dizziness, speech difficulty, weakness and numbness.  Hematological: Negative for adenopathy.  Psychiatric/Behavioral: Negative for confusion and agitation. The patient is not nervous/anxious.        Objective:   Physical Exam  Constitutional: She is oriented to person, place, and time. She  appears well-developed and well-nourished. No distress.  HENT:  Head: Normocephalic.  Mouth/Throat: Oropharynx is clear and moist. No oropharyngeal exudate.  Eyes: Conjunctivae and EOM are normal. Pupils are equal, round, and reactive to light. No scleral icterus.  Neck: Normal range of motion. No tracheal deviation present.  Cardiovascular: Normal rate and intact distal pulses.   Pulmonary/Chest: Effort normal. No respiratory distress. She exhibits no tenderness. Right breast exhibits no inverted nipple and no mass. Left breast exhibits no inverted nipple and no mass. Breasts are symmetrical.    Abdominal: Soft. She exhibits no distension. There is no tenderness. Hernia confirmed negative in the right inguinal area and confirmed negative in the left inguinal area.       Incisions clean with normal healing ridges.  No hernias  Genitourinary: No vaginal discharge found.  Musculoskeletal: Normal range of motion. She exhibits no tenderness.  Lymphadenopathy:       Right: No inguinal adenopathy present.       Left: No inguinal adenopathy present.  Neurological: She is alert and oriented to person, place, and time. No cranial nerve deficit. She exhibits normal muscle tone. Coordination normal.  Skin: Skin is warm and dry. No rash noted. She is not diaphoretic.  Psychiatric: She has a normal mood and affect. Her behavior is normal.       Assessment:     More bilateral axillary abscesses in the setting of hidradenitis suppurativa.    Plan:     I feel these need to be drained.  She was hoping to just use compresses and wait. She noted it was extremely tender with the drainage in the emergency room. The abscess was actually quite deep and uncomfortable. She did not want to have office drainage.    She initially was hoping to wait a few weeks since now is inconvenient. I cautioned that with this right abscess being a large size and very tender, it would not resolve about drainage & will worsen.  I think it is reasonable to do this in the operating room where she can be more sedated & comfortable and be able to have better duration since there seemed to be numerous fistula tracking from different areas.  Eventually she agreed that something needed to be done soon. She is trying to arrange for family to help watch her child.  I think she would benefit from IV antibiotics to better control the cellulitis as well. We are working to try and admit her and do this tonight.  She was hoping to have wide excision done today. I noted that was not a good idea in the setting of infection. I do agree given her strong family history and personal experience, she will require wide excision at some point. She does not have massive volume of redundant skin to allow primary closure. Probably would benefit from evaluation by one of [place] in our group of plastic surgery  that does this is more regularly than myself.

## 2011-11-13 NOTE — Patient Instructions (Signed)
Hidradenitis Suppurativa, Sweat Gland Abscess Hidradenitis suppurativa is a long lasting (chronic), uncommon disease of the sweat glands. With this, boil-like lumps and scarring develop in the groin, some times under the arms (axillae), and under the breasts. It may also uncommonly occur behind the ears, in the crease of the buttocks, and around the genitals.  CAUSES  The cause is from a blocking of the sweat glands. They then become infected. It may cause drainage and odor. It is not contagious. So it cannot be given to someone else. It most often shows up in puberty (about 57 to 27 years of age). But it may happen much later. It is similar to acne which is a disease of the sweat glands. This condition is slightly more common in African-Americans and women. SYMPTOMS   Hidradenitis usually starts as one or more red, tender, swellings in the groin or under the arms (axilla).   Over a period of hours to days the lesions get larger. They often open to the skin surface, draining clear to yellow-colored fluid.   The infected area heals with scarring.  DIAGNOSIS  Your caregiver makes this diagnosis by looking at you. Sometimes cultures (growing germs on plates in the lab) may be taken. This is to see what germ (bacterium) is causing the infection.  TREATMENT   Topical germ killing medicine applied to the skin (antibiotics) are the treatment of choice. Antibiotics taken by mouth (systemic) are sometimes needed when the condition is getting worse or is severe.   Avoid tight-fitting clothing which traps moisture in.   Dirt does not cause hidradenitis and it is not caused by poor hygiene.   Involved areas should be cleaned daily using an antibacterial soap. Some patients find that the liquid form of Lever 2000, applied to the involved areas as a lotion after bathing, can help reduce the odor related to this condition.   Sometimes surgery is needed to drain infected areas or remove scarred tissue.  Removal of large amounts of tissue is used only in severe cases.   Birth control pills may be helpful.   Oral retinoids (vitamin A derivatives) for 6 to 12 months which are effective for acne may also help this condition.   Weight loss will improve but not cure hidradenitis. It is made worse by being overweight. But the condition is not caused by being overweight.   This condition is more common in people who have had acne.   It may become worse under stress.  There is no medical cure for hidradenitis. It can be controlled, but not cured. The condition usually continues for years with periods of getting worse and getting better (remission). Document Released: 01/14/2004 Document Revised: 05/21/2011 Document Reviewed: 01/30/2008 [facility] Patient Information 2012 [place], [place].  Smoking Cessation, Tips for Success YOU CAN QUIT SMOKING If you are ready to quit smoking, congratulations! You have chosen to help yourself be healthier. Cigarettes bring nicotine, tar, carbon monoxide, and other irritants into your body. Your lungs, heart, and blood vessels will be able to work better without these poisons. There are many different ways to quit smoking. Nicotine gum, nicotine patches, a nicotine inhaler, or nicotine nasal spray can help with physical craving. Hypnosis, support groups, and medicines help break the habit of smoking. Here are some tips to help you quit for good.  Throw away all cigarettes.   Clean and remove all ashtrays from your home, work, and car.   On a card, write down your reasons for quitting. Carry  the card with you and read it when you get the urge to smoke.   Cleanse your body of nicotine. Drink enough water and fluids to keep your urine clear or pale yellow. Do this after quitting to flush the nicotine from your body.   Learn to predict your moods. Do not let a bad situation be your excuse to have a cigarette. Some situations in your life might tempt you into wanting a  cigarette.   Never have "just one" cigarette. It leads to wanting another and another. Remind yourself of your decision to quit.   Change habits associated with smoking. If you smoked while driving or when feeling stressed, try other activities to replace smoking. Stand up when drinking your coffee. Brush your teeth after eating. Sit in a different chair when you read the paper. Avoid alcohol while trying to quit, and try to drink fewer caffeinated beverages. Alcohol and caffeine may urge you to smoke.   Avoid foods and drinks that can trigger a desire to smoke, such as sugary or spicy foods and alcohol.   Ask people who smoke not to smoke around you.   Have something planned to do right after eating or having a cup of coffee. Take a walk or exercise to perk you up. This will help to keep you from overeating.   Try a relaxation exercise to calm you down and decrease your stress. Remember, you may be tense and nervous for the first 2 weeks after you quit, but this will pass.   Find new activities to keep your hands busy. Play with a pen, coin, or rubber band. Doodle or draw things on paper.   Brush your teeth right after eating. This will help cut down on the craving for the taste of tobacco after meals. You can try mouthwash, too.   Use oral substitutes, such as lemon drops, carrots, a cinnamon stick, or chewing gum, in place of cigarettes. Keep them handy so they are available when you have the urge to smoke.   When you have the urge to smoke, try deep breathing.   Designate your home as a nonsmoking area.   If you are a heavy smoker, ask your caregiver about a prescription for nicotine chewing gum. It can ease your withdrawal from nicotine.   Reward yourself. Set aside the cigarette money you save and buy yourself something nice.   Look for support from others. Join a support group or smoking cessation program. Ask someone at home or at work to help you with your plan to quit smoking.     Always ask yourself, "Do I need this cigarette or is this just a reflex?" Tell yourself, "Today, I choose not to smoke," or "I do not want to smoke." You are reminding yourself of your decision to quit, even if you do smoke a cigarette.  HOW WILL I FEEL WHEN I QUIT SMOKING?  The benefits of not smoking start within days of quitting.   You may have symptoms of withdrawal because your body is used to nicotine (the addictive substance in cigarettes). You may crave cigarettes, be irritable, feel very hungry, cough often, get headaches, or have difficulty concentrating.   The withdrawal symptoms are only temporary. They are strongest when you first quit but will go away within 10 to 14 days.   When withdrawal symptoms occur, stay in control. Think about your reasons for quitting. Remind yourself that these are signs that your body is healing and getting used to  being without cigarettes.   Remember that withdrawal symptoms are easier to treat than the major diseases that smoking can cause.   Even after the withdrawal is over, expect periodic urges to smoke. However, these cravings are generally short-lived and will go away whether you smoke or not. Do not smoke!   If you relapse and smoke again, do not lose hope. Most smokers quit 3 times before they are successful.   If you relapse, do not give up! Plan ahead and think about what you will do the next time you get the urge to smoke.  LIFE AS A NONSMOKER: MAKE IT FOR A MONTH, MAKE IT FOR LIFE Day 1: Hang this page where you will see it every day. Day 2: Get rid of all ashtrays, matches, and lighters. Day 3: Drink water. Breathe deeply between sips. Day 4: Avoid places with smoke-filled air, such as bars, clubs, or the smoking section of restaurants. Day 5: Keep track of how much money you save by not smoking. Day 6: Avoid boredom. Keep a good book with you or go to the movies. Day 7: Reward yourself! One week without smoking! Day 8: Make a  dental appointment to get your teeth cleaned. Day 9: Decide how you will turn down a cigarette before it is offered to you. Day 10: Review your reasons for quitting. Day 11: Distract yourself. Stay active to keep your mind off smoking and to relieve tension. Take a walk, exercise, read a book, do a crossword puzzle, or try a new hobby. Day 12: Exercise. Get off the bus before your stop or use stairs instead of escalators. Day 13: Call on friends for support and encouragement. Day 14: Reward yourself! Two weeks without smoking! Day 15: Practice deep breathing exercises. Day 16: Bet a friend that you can stay a nonsmoker. Day 17: Ask to sit in nonsmoking sections of restaurants. Day 18: Hang up "No Smoking" signs. Day 19: Think of yourself as a nonsmoker. Day 20: Each morning, tell yourself you will not smoke. Day 21: Reward yourself! Three weeks without smoking! Day 22: Think of smoking in negative ways. Remember how it stains your teeth, gives you bad breath, and leaves you short of breath. Day 23: Eat a nutritious breakfast. Day 24:Do not relive your days as a smoker. Day 25: Hold a pencil in your hand when talking on the telephone. Day 26: Tell all your friends you do not smoke. Day 27: Think about how much better food tastes. Day 28: Remember, one cigarette is one too many. Day 29: Take up a hobby that will keep your hands busy. Day 30: Congratulations! One month without smoking! Give yourself a big reward. Your caregiver can direct you to community resources or hospitals for support, which may include:  Group support.   Education.   Hypnosis.   Subliminal therapy.  Document Released: 02/28/2004 Document Revised: 05/21/2011 Document Reviewed: 03/18/2009 [facility] Patient Information 2012 [place], [place].

## 2011-11-14 ENCOUNTER — Encounter (HOSPITAL_COMMUNITY): Payer: Self-pay | Admitting: Anesthesiology

## 2011-11-14 ENCOUNTER — Encounter (HOSPITAL_COMMUNITY): Admission: AD | Disposition: A | Discharge status: Home / Self Care | Payer: Self-pay | Source: Ambulatory Visit

## 2011-11-14 ENCOUNTER — Observation Stay (HOSPITAL_COMMUNITY): Payer: Medicaid Other | Admitting: Anesthesiology

## 2011-11-14 DIAGNOSIS — IMO0002 Reserved for concepts with insufficient information to code with codable children: Secondary | ICD-10-CM

## 2011-11-14 SURGERY — INCISION AND DRAINAGE, ABSCESS
Anesthesia: General | Site: Axilla | Laterality: Bilateral | Wound class: Dirty or Infected

## 2011-11-14 MED ORDER — LACTATED RINGERS IV SOLN
INTRAVENOUS | Status: DC | PRN
  Administered 2011-11-14: 12:00:00 via INTRAVENOUS

## 2011-11-14 MED ORDER — FENTANYL CITRATE 0.05 MG/ML IJ SOLN
25.0000 ug | INTRAMUSCULAR | Status: DC | PRN
  Administered 2011-11-14 (×2): 50 ug via INTRAVENOUS

## 2011-11-14 MED ORDER — PROPOFOL 10 MG/ML IV BOLUS
INTRAVENOUS | Status: DC | PRN
  Administered 2011-11-14: 120 mg via INTRAVENOUS

## 2011-11-14 MED ORDER — HYDROMORPHONE HCL PF 1 MG/ML IJ SOLN
0.5000 mg | INTRAMUSCULAR | Status: DC | PRN
  Administered 2011-11-14 – 2011-11-15 (×6): 1 mg via INTRAVENOUS
  Filled 2011-11-14 (×6): qty 1

## 2011-11-14 MED ORDER — ONDANSETRON HCL 4 MG/2ML IJ SOLN
INTRAMUSCULAR | Status: DC | PRN
  Administered 2011-11-14: 4 mg via INTRAVENOUS

## 2011-11-14 MED ORDER — LACTATED RINGERS IV SOLN: INTRAVENOUS | Status: DC

## 2011-11-14 MED ORDER — BUPIVACAINE HCL (PF) 0.25 % IJ SOLN
INTRAMUSCULAR | Status: DC | PRN
  Administered 2011-11-14: 20 mL

## 2011-11-14 MED ORDER — KETOROLAC TROMETHAMINE 30 MG/ML IJ SOLN
INTRAMUSCULAR | Status: DC | PRN
  Administered 2011-11-14: 30 mg via INTRAVENOUS

## 2011-11-14 MED ORDER — LIDOCAINE HCL (CARDIAC) 20 MG/ML IV SOLN
INTRAVENOUS | Status: DC | PRN
  Administered 2011-11-14: 50 mg via INTRAVENOUS

## 2011-11-14 MED ORDER — MIDAZOLAM HCL 5 MG/5ML IJ SOLN
INTRAMUSCULAR | Status: DC | PRN
  Administered 2011-11-14: 2 mg via INTRAVENOUS

## 2011-11-14 MED ORDER — LACTATED RINGERS IV SOLN
INTRAVENOUS | Status: DC
  Administered 2011-11-14: 50 mL/h via INTRAVENOUS
  Administered 2011-11-15 – 2011-11-17 (×3): via INTRAVENOUS

## 2011-11-14 MED ORDER — FENTANYL CITRATE 0.05 MG/ML IJ SOLN
INTRAMUSCULAR | Status: DC | PRN
  Administered 2011-11-14 (×2): 25 ug via INTRAVENOUS
  Administered 2011-11-14: 50 ug via INTRAVENOUS

## 2011-11-14 MED ORDER — 0.9 % SODIUM CHLORIDE (POUR BTL) OPTIME
TOPICAL | Status: DC | PRN
  Administered 2011-11-14: 1000 mL

## 2011-11-14 MED ORDER — KETOROLAC TROMETHAMINE 30 MG/ML IJ SOLN
30.0000 mg | Freq: Three times a day (TID) | INTRAMUSCULAR | Status: DC | PRN
  Administered 2011-11-14: 30 mg via INTRAVENOUS
  Filled 2011-11-14: qty 1

## 2011-11-14 SURGICAL SUPPLY — 34 items
BANDAGE GAUZE ELAST BULKY 4 IN (GAUZE/BANDAGES/DRESSINGS) IMPLANT
BLADE SURG 15 STRL LF DISP TIS (BLADE) ×1 IMPLANT
BLADE SURG 15 STRL SS (BLADE) ×1
CANISTER SUCTION 2500CC (MISCELLANEOUS) ×2 IMPLANT
CLOTH BEACON ORANGE TIMEOUT ST (SAFETY) ×2 IMPLANT
COVER SURGICAL LIGHT HANDLE (MISCELLANEOUS) ×2 IMPLANT
DECANTER SPIKE VIAL GLASS SM (MISCELLANEOUS) IMPLANT
DRAPE LAPAROSCOPIC ABDOMINAL (DRAPES) IMPLANT
DRESSING ADAPTIC 1/2  N-ADH (PACKING) ×2 IMPLANT
DRSG PAD ABDOMINAL 8X10 ST (GAUZE/BANDAGES/DRESSINGS) IMPLANT
ELECT CAUTERY BLADE 6.4 (BLADE) ×2 IMPLANT
ELECT REM PT RETURN 9FT ADLT (ELECTROSURGICAL) ×2
ELECTRODE REM PT RTRN 9FT ADLT (ELECTROSURGICAL) ×1 IMPLANT
GAUZE PACKING 1/2 X5 YD (GAUZE/BANDAGES/DRESSINGS) ×2 IMPLANT
GLOVE BIO SURGEON STRL SZ7.5 (GLOVE) ×2 IMPLANT
GLOVE SURG SIGNA 7.5 PF LTX (GLOVE) ×2 IMPLANT
GOWN STRL NON-REIN LRG LVL3 (GOWN DISPOSABLE) ×4 IMPLANT
KIT BASIN OR (CUSTOM PROCEDURE TRAY) ×2 IMPLANT
NEEDLE HYPO 25X1 1.5 SAFETY (NEEDLE) IMPLANT
NS IRRIG 1000ML POUR BTL (IV SOLUTION) ×2 IMPLANT
PENCIL BUTTON HOLSTER BLD 10FT (ELECTRODE) ×2 IMPLANT
SPONGE GAUZE 4X4 12PLY (GAUZE/BANDAGES/DRESSINGS) ×2 IMPLANT
SPONGE LAP 18X18 X RAY DECT (DISPOSABLE) IMPLANT
SUT MNCRL AB 4-0 PS2 18 (SUTURE) IMPLANT
SUT VIC AB 3-0 SH 27 (SUTURE)
SUT VIC AB 3-0 SH 27XBRD (SUTURE) IMPLANT
SWAB COLLECTION DEVICE MRSA (MISCELLANEOUS) IMPLANT
SYR BULB 3OZ (MISCELLANEOUS) IMPLANT
SYR CONTROL 10ML LL (SYRINGE) IMPLANT
TAPE CLOTH SURG 6X10 WHT LF (GAUZE/BANDAGES/DRESSINGS) ×2 IMPLANT
TOWEL OR 17X26 10 PK STRL BLUE (TOWEL DISPOSABLE) ×2 IMPLANT
TUBE ANAEROBIC SPECIMEN COL (MISCELLANEOUS) IMPLANT
WATER STERILE IRR 1000ML POUR (IV SOLUTION) IMPLANT
YANKAUER SUCT BULB TIP NO VENT (SUCTIONS) IMPLANT

## 2011-11-14 NOTE — Progress Notes (Signed)
Patient ID: Lisa Guerrero, female   DOB: 03-05-1986, 26 y.o.   MRN: [id number]  Patient with bilateral axillary hidradenitis and abscess.  Admitted last evening from the office Wounds with purulence and tenderness, right worse than left  Plan:  Incision and Drainage of bilateral axillary abscess today in the OR by Dr. Ezzard Standing or Magnus Ivan Risks including, but not limited to bleeding, ongoing infection, post of wound care, recurrence, etc discussed with the patient

## 2011-11-14 NOTE — Anesthesia Preprocedure Evaluation (Addendum)

## 2011-11-14 NOTE — Transfer of Care (Signed)
Immediate Anesthesia Transfer of Care Note  Patient: Lisa Guerrero  Procedure(s) Performed: Procedure(s) (LRB): INCISION AND DRAINAGE ABSCESS (Bilateral)  Patient Location: PACU  Anesthesia Type: General  Level of Consciousness: sedated, patient cooperative and responds to stimulaton  Airway & Oxygen Therapy: Patient Spontanous Breathing and Patient connected to face mask oxgen  Post-op Assessment: Report given to PACU RN and Post -op Vital signs reviewed and stable  Post vital signs: Reviewed and stable  Complications: No apparent anesthesia complications

## 2011-11-14 NOTE — Interval H&P Note (Signed)
History and Physical Interval Note: bilateral axillary abscess in face of chronic hidradenitis  11/14/2011 11:25 AM  Lisa Guerrero  has presented today for surgery, with the diagnosis of bilateral axillary abcess  The various methods of treatment have been discussed with the patient and family. After consideration of risks, benefits and other options for treatment, the patient has consented to  Procedure(s) (LRB): INCISION AND DRAINAGE ABSCESS (Bilateral) as a surgical intervention .  The patients' history has been reviewed, patient examined, no change in status, stable for surgery.  I have reviewed the patients' chart and labs.  Questions were answered to the patient's satisfaction.     Delila Kuklinski A

## 2011-11-14 NOTE — H&P (View-Only) (Signed)
Subjective:     Patient ID: Lisa Guerrero, female   DOB: 06/02/1986, 25 y.o.   MRN: [id number]  HPI  Lisa Guerrero  04/03/1986 [id number]  Patient Care Team: Henry H Dorn as PCP - General (Specialist) Douglas A Blackman, MD as Consulting Physician (General Surgery)  This patient is a 25 y.o.female who presents today for surgical evaluation.   Reason for visit: Worsening pain and axilla. History of hydradenitis.  Patient is a young smoking female who's had problems with infections in both armpits. Has been drained in numerous times in the past. Was seen in the ER in February. Followup with Dr. Ingram. Infection was improving. He recommended consideration of elective wide excision. Felt plastic surgery would be a good option. She does not recall setting up an appointment with them.  She recalls many family members having problems with this as well. They only got better after the sweat glands were removed with surgery.  She noted that the left armpit recurrently drains. It is swollen and more tender. The right side has been very swollen. Starting to drain out. Very comfortable. She is frustrated. She feels like she has not been but clear to infection for the past 3 months. No hernia proximal right now. She was due to followup with us in a few weeks.   She wished to be seen today as she was worried since the tenderness & swelling in the right armpit has rapidly been getting worse  Patient Active Problem List  Diagnoses  . Hidradenitis suppurativa, chronic & recurrent  . IBS (irritable bowel syndrome)  . Axillary abscess, right 4x3cm  . Abscess of left axilla 3x2cm  . Current smoker    Past Medical History  Diagnosis Date  . Hidradenitis     CHRONIC  . Anemia   . IBS (irritable bowel syndrome)   . Hidradenitis suppurativa 11/13/2011    Past Surgical History  Procedure Date  . Drainage axillary abscess     History   Social History  . Marital Status: Single    Spouse  Name: N/A    Number of Children: N/A  . Years of Education: N/A   Occupational History  . Not on file.   Social History Main Topics  . Smoking status: Current Everyday Smoker -- 0.2 packs/day  . Smokeless tobacco: Not on file  . Alcohol Use: Yes     social  . Drug Use: No  . Sexually Active: Not Currently    Birth Control/ Protection: None   Other Topics Concern  . Not on file   Social History Narrative  . No narrative on file    Family History  Problem Relation Age of Onset  . Heart disease Maternal Grandmother     Current Outpatient Prescriptions  Medication Sig Dispense Refill  . acetaminophen-codeine (TYLENOL #3) 300-30 MG per tablet Take 1 tablet by mouth every 4 (four) hours as needed.      . Sulfamethoxazole-Trimethoprim (BACTRIM PO) Take by mouth.         No Known Allergies  BP 110/70  Pulse 68  Temp 98 F (36.7 C)  Resp 14  Ht 5' 5" (1.651 m)  Wt 144 lb 6.4 oz (65.499 kg)  BMI 24.03 kg/m2  No results found.  Review of Systems  Constitutional: Negative for fever, chills and diaphoresis.  HENT: Negative for ear pain, sore throat and trouble swallowing.   Eyes: Negative for photophobia and visual disturbance.  Respiratory: Negative for cough and choking.     Cardiovascular: Negative for chest pain and palpitations.  Gastrointestinal: Negative for nausea, vomiting, abdominal pain, diarrhea, constipation, anal bleeding and rectal pain.  Genitourinary: Negative for dysuria, frequency and difficulty urinating.  Musculoskeletal: Negative for myalgias and gait problem.  Skin: Positive for wound. Negative for color change, pallor and rash.  Neurological: Negative for dizziness, speech difficulty, weakness and numbness.  Hematological: Negative for adenopathy.  Psychiatric/Behavioral: Negative for confusion and agitation. The patient is not nervous/anxious.        Objective:   Physical Exam  Constitutional: She is oriented to person, place, and time. She  appears well-developed and well-nourished. No distress.  HENT:  Head: Normocephalic.  Mouth/Throat: Oropharynx is clear and moist. No oropharyngeal exudate.  Eyes: Conjunctivae and EOM are normal. Pupils are equal, round, and reactive to light. No scleral icterus.  Neck: Normal range of motion. No tracheal deviation present.  Cardiovascular: Normal rate and intact distal pulses.   Pulmonary/Chest: Effort normal. No respiratory distress. She exhibits no tenderness. Right breast exhibits no inverted nipple and no mass. Left breast exhibits no inverted nipple and no mass. Breasts are symmetrical.    Abdominal: Soft. She exhibits no distension. There is no tenderness. Hernia confirmed negative in the right inguinal area and confirmed negative in the left inguinal area.       Incisions clean with normal healing ridges.  No hernias  Genitourinary: No vaginal discharge found.  Musculoskeletal: Normal range of motion. She exhibits no tenderness.  Lymphadenopathy:       Right: No inguinal adenopathy present.       Left: No inguinal adenopathy present.  Neurological: She is alert and oriented to person, place, and time. No cranial nerve deficit. She exhibits normal muscle tone. Coordination normal.  Skin: Skin is warm and dry. No rash noted. She is not diaphoretic.  Psychiatric: She has a normal mood and affect. Her behavior is normal.       Assessment:     More bilateral axillary abscesses in the setting of hidradenitis suppurativa.    Plan:     I feel these need to be drained.  She was hoping to just use compresses and wait. She noted it was extremely tender with the drainage in the emergency room. The abscess was actually quite deep and uncomfortable. She did not want to have office drainage.    She initially was hoping to wait a few weeks since now is inconvenient. I cautioned that with this right abscess being a large size and very tender, it would not resolve about drainage & will worsen.  I think it is reasonable to do this in the operating room where she can be more sedated & comfortable and be able to have better duration since there seemed to be numerous fistula tracking from different areas.  Eventually she agreed that something needed to be done soon. She is trying to arrange for family to help watch her child.  I think she would benefit from IV antibiotics to better control the cellulitis as well. We are working to try and admit her and do this tonight.  She was hoping to have wide excision done today. I noted that was not a good idea in the setting of infection. I do agree given her strong family history and personal experience, she will require wide excision at some point. She does not have massive volume of redundant skin to allow primary closure. Probably would benefit from evaluation by one of us in our group of plastic surgery   that does this is more regularly than myself.      

## 2011-11-14 NOTE — Op Note (Signed)
INCISION AND DRAINAGE ABSCESS  Procedure Note  Lisa Guerrero 11/13/2011 - 11/14/2011   Pre-op Diagnosis: bilateral axillary abcess     Post-op Diagnosis: same  Procedure(s): INCISION AND DRAINAGE ABSCESS BILATERAL AXILLA  Surgeon(s): Shelly Rubenstein, MD  Anesthesia: General  Staff:  Francesco Runner, RN - Circulator Arelia Longest Jehu-Appiah, CST - Scrub Person  Estimated Blood Loss: Minimal               Procedure: The patient was brought to the operating room and identified as the correct patient. She was placed supine on the operating room table and anesthesia was induced. Her axilla were then prepped and draped in the usual sterile fashion. I anesthetized the right axilla with Marcaine. I then made an incision with a scalpel and entered the abscess cavity which was drained with minimal purulent fluid. I packed this with quarter inch plain packing gauze. I then anesthetized the other axilla and drainage abscess cavity as well. This was likewise packed. Both were superficial and consistent with hidradenitis. Dry gauze and tape was applied. The patient tolerated the procedure well. All the counts were correct at the end of the procedure. The patient was then extubated in the operating room and taken in a stable condition to the recovery room.          Jesten Cappuccio A   Date: 11/14/2011  Time: 12:44 PM

## 2011-11-14 NOTE — Anesthesia Postprocedure Evaluation (Signed)
  Anesthesia Post-op Note  Patient: Lisa Guerrero  Procedure(s) Performed: Procedure(s) (LRB): INCISION AND DRAINAGE ABSCESS (Bilateral)  Patient Location: PACU  Anesthesia Type: General  Level of Consciousness: awake and alert   Airway and Oxygen Therapy: Patient Spontanous Breathing  Post-op Pain: mild  Post-op Assessment: Post-op Vital signs reviewed, Patient's Cardiovascular Status Stable, Respiratory Function Stable, Patent Airway and No signs of Nausea or vomiting  Post-op Vital Signs: stable  Complications: No apparent anesthesia complications

## 2011-11-15 MED ORDER — CEPHALEXIN 500 MG PO CAPS
500.0000 mg | ORAL_CAPSULE | Freq: Three times a day (TID) | ORAL | Status: DC
  Administered 2011-11-15 – 2011-11-17 (×6): 500 mg via ORAL
  Filled 2011-11-15 (×8): qty 1

## 2011-11-15 NOTE — Progress Notes (Signed)
Patient ID: Lisa Guerrero, female   DOB: Jul 22, 1985, 26 y.o.   MRN: [id number] Subjective: POD#1 Comfortable from a pain standpoint C/o nausea  Objective: Physical Exam: BP 109/74  Pulse 63  Temp(Src) 98 F (36.7 C) (Oral)  Resp 18  Ht 5\' 5"  (1.651 m)  Wt 144 lb (65.318 kg)  BMI 23.96 kg/m2  SpO2 100%  LMP 10/21/2011 Wounds clean, packing removed from both axilla and repacked Erythema improved    Labs: CBC  Basename 11/13/11 2105  WBC 4.2  HGB 12.0  HCT 36.8  PLT 304   BMET  Basename 11/13/11 2105  NA --  K --  CL --  CO2 --  GLUCOSE --  BUN --  CREATININE 0.72  CALCIUM --   LFT No results found for this basename: PROT,ALBUMIN,AST,ALT,ALKPHOS,BILITOT,BILIDIR,IBILI,LIPASE in the last 72 hours PT/INR No results found for this basename: LABPROT:2,INR:2 in the last 72 hours   Studies/Results: No results found.  Assessment/Plan: S/p I and D bilateral axillary abscess, chronic hidradenitis  Continue IV antibiotics, wound care, pain management    LOS: 2 days    Crespin Forstrom A MD 11/15/2011 8:55 AM

## 2011-11-16 LAB — COMPREHENSIVE METABOLIC PANEL
ALT: 9 U/L (ref 0–35)
AST: 20 U/L (ref 0–37)
Albumin: 4.1 g/dL (ref 3.5–5.2)
Calcium: 9.7 mg/dL (ref 8.4–10.5)
Creatinine, Ser: 0.67 mg/dL (ref 0.50–1.10)
Sodium: 135 mEq/L (ref 135–145)

## 2011-11-16 LAB — CBC
MCH: 31 pg (ref 26.0–34.0)
MCV: 92.4 fL (ref 78.0–100.0)
Platelets: 299 10*3/uL (ref 150–400)
RBC: 4.48 MIL/uL (ref 3.87–5.11)
RDW: 12.9 % (ref 11.5–15.5)
WBC: 8.1 10*3/uL (ref 4.0–10.5)

## 2011-11-16 MED ORDER — ONDANSETRON HCL 4 MG PO TABS
4.0000 mg | ORAL_TABLET | Freq: Three times a day (TID) | ORAL | Status: DC | PRN
  Administered 2011-11-16: 4 mg via ORAL
  Filled 2011-11-16: qty 1

## 2011-11-16 MED ORDER — SODIUM CHLORIDE 0.9 % IV SOLN: 6.2500 mg | Freq: Once | INTRAVENOUS | Status: DC

## 2011-11-16 MED ORDER — ONDANSETRON HCL 4 MG/2ML IJ SOLN
6.2500 mg | Freq: Once | INTRAMUSCULAR | Status: AC
  Administered 2011-11-16: 6.25 mg via INTRAVENOUS

## 2011-11-16 MED ORDER — POTASSIUM CHLORIDE CRYS ER 20 MEQ PO TBCR
20.0000 meq | EXTENDED_RELEASE_TABLET | Freq: Two times a day (BID) | ORAL | Status: DC
  Administered 2011-11-16 – 2011-11-17 (×2): 20 meq via ORAL
  Filled 2011-11-16 (×3): qty 1

## 2011-11-16 MED ORDER — PROMETHAZINE HCL 25 MG/ML IJ SOLN
6.2500 mg | Freq: Four times a day (QID) | INTRAMUSCULAR | Status: DC | PRN
  Administered 2011-11-16: 12.5 mg via INTRAVENOUS
  Filled 2011-11-16: qty 1

## 2011-11-16 MED ORDER — ALPRAZOLAM 0.25 MG PO TABS
0.2500 mg | ORAL_TABLET | Freq: Three times a day (TID) | ORAL | Status: DC | PRN
  Filled 2011-11-16: qty 1

## 2011-11-16 NOTE — Progress Notes (Signed)
UR complete 

## 2011-11-16 NOTE — Progress Notes (Signed)
2 Days Post-Op  Subjective: C/o nausea, dizziness, seeing spots.  Upset school to start tomorrow, boyfriend is in trouble. She has 2 kids at home and very upset/anxious.  Recurrent nausea, vomited after PO Zofran. Sitting up in bed crying, and still feels dizzy. Mother told nurse she wanted an MRI. Objective: Vital signs in last 24 hours: Temp:  [97.6 F (36.4 C)-98.5 F (36.9 C)] 98.3 F (36.8 C) (06/03 0956) Pulse Rate:  [72-96] 85  (06/03 0956) Resp:  [18-20] 20  (06/03 0956) BP: (105-128)/(62-89) 112/62 mmHg (06/03 0956) SpO2:  [97 %-100 %] 100 % (06/03 0956) Last BM Date: 11/13/11  I/o negative 3.3 liters, Only 360 PO yesterday, afebrile, VSS, BP in bed 112/62. NO labs,  Intake/Output from previous day: 06/02 0701 - 06/03 0700 In: 915 [P.O.:360; I.V.:555] Out: 4300 [Urine:4300] Intake/Output this shift:    General appearance: alert, cooperative and crying up in bed. Resp: clear to auscultation bilaterally Skin: Skin color, texture, turgor normal. No rashes or lesions or Dressing and packin removed from each incsions both axilla.  Both are clean  will repack with 2/2 to wick site. Neurologic: Alert and oriented X 3, normal strength and tone. No focal changes.  Lab Results:   [facility] 11/13/11 2105  WBC 4.2  HGB 12.0  HCT 36.8  PLT 304    BMET  Basename 11/13/11 2105  NA --  K --  CL --  CO2 --  GLUCOSE --  BUN --  CREATININE 0.72  CALCIUM --   PT/INR No results found for this basename: LABPROT:2,INR:2 in the last 72 hours  No results found for this basename: AST:5,ALT:5,ALKPHOS:5,BILITOT:5,PROT:5,ALBUMIN:5 in the last 168 hours   Lipase  No results found for this basename: lipase     Studies/Results: No results found.  Medications:    . cephALEXin  500 mg Oral Q8H  . lip balm  1 application Topical BID  . DISCONTD:  ceFAZolin (ANCEF) IV  2 g Intravenous Q8H    Assessment/Plan S/p I and D bilateral axillary abscess, chronic hidradenitis s/p  I/D both axilla abscess 11/14/11 Dr. Magnus Ivan Hidradenitis suppurativa, chronic & recurrent  .  IBS (irritable bowel syndrome)  .  Axillary abscess, right 4x3cm  .  Abscess of left axilla 3x2cm  .  Current smoker    Plan:  I put her IV back in, check her labs, treat her nausea with phenergan.   I don't see a culture.  Continue IV ancef for now, rehydrate and hopefully send her home tomorrow..    Add some xanax for her anxiety.    LOS: 3 days    Kristell Wooding 11/16/2011

## 2011-11-17 MED ORDER — ACETAMINOPHEN 325 MG PO TABS: 650.0000 mg | ORAL_TABLET | Freq: Four times a day (QID) | ORAL | Status: DC | PRN

## 2011-11-17 MED ORDER — OXYCODONE HCL 5 MG PO TABS: 5.0000 mg | ORAL_TABLET | ORAL | Status: AC | PRN

## 2011-11-17 MED ORDER — CEPHALEXIN 500 MG PO CAPS: 500.0000 mg | ORAL_CAPSULE | Freq: Three times a day (TID) | ORAL | Status: AC

## 2011-11-17 NOTE — Discharge Summary (Signed)
Physician Discharge Summary  Patient ID: Lisa Guerrero MRN: [id number] DOB/AGE: [birth year] 26 y.o.  Admit date: 11/13/2011 Discharge date: 11/17/2011  Admission Diagnoses: Bilateral axillary abscesses in the setting of hidradenitis suppurativa .  Hidradenitis suppurativa, chronic & recurrent  .  IBS (irritable bowel syndrome)  .  Axillary abscess, right 4x3cm  .  Abscess of left axilla 3x2cm  .  Current smoker    Discharge Diagnoses: Same Anxiety Nausea and vomiting Active Problems:  * No active hospital problems. *    PROCEDURES: INCISION AND DRAINAGE ABSCESS BILATERAL AXILLA 11/14/2011 Shelly Rubenstein MD     Hospital Course: Patient is a young smoking female who's had problems with infections in both armpits. Has been drained in numerous times in the past. Was seen in the ER in February. Followup with Dr. Derrell Lolling. Infection was improving. He recommended consideration of elective wide excision. Felt plastic surgery would be a good option. She does not recall setting up an appointment with them. She recalls many family members having problems with this as well. They only got better after the sweat glands were removed with surgery.  She noted that the left armpit recurrently drains. It is swollen and more tender. The right side has been very swollen. Starting to drain out. Very comfortable. She is frustrated. She feels like she has not been but clear to infection for the past 3 months. No hernia proximal right now. She was due to followup with [place] in a few weeks. She wished to be seen today as she was worried since the tenderness & swelling in the right armpit has rapidly been getting worse.  Pt admitted and underwent I/D as noted above.  She tolerated the procedure well but complained of nausea the 1st Post op day.  She was continued on antibiotics, and wound care. 2nd post op day she c/o dizziness, nausea, vomiting, seeing spots, and was very upset about problems at home.  We  replaced IV hydrated her and treated her nausea. She was up walking and eating later that day.  Today the sites are open still very tender.  She less upset by her home issues.  We plan to have her shower 2-3 times per day and redress.  10 days of Keflex and follow up with DR. Blackman in 10 days.   Disposition: 01-Home or Self Care   Medication List  As of 11/17/2011  8:11 AM   STOP taking these medications         acetaminophen-codeine 300-30 MG per tablet      sulfamethoxazole-trimethoprim 400-80 MG per tablet         TAKE these medications         acetaminophen 325 MG tablet   Commonly known as: TYLENOL   Take 2 tablets (650 mg total) by mouth every 6 (six) hours as needed (or Temp > 100).      cephALEXin 500 MG capsule   Commonly known as: KEFLEX   Take 1 capsule (500 mg total) by mouth every 8 (eight) hours.      ibuprofen 200 MG tablet   Commonly known as: ADVIL,MOTRIN   Take 600 mg by mouth every 6 (six) hours as needed.      oxyCODONE 5 MG immediate release tablet   Commonly known as: Oxy IR/ROXICODONE   Take 1-2 tablets (5-10 mg total) by mouth every 4 (four) hours as needed.           Follow-up Information    Follow up  with BLACKMAN,DOUGLAS A, MD. Schedule an appointment as soon as possible for a visit in 10 days.   Contact information:   [employer] [address] [place] [address] [phone]          Signed: Sherrie George 11/17/2011, 8:11 AM

## 2011-11-17 NOTE — Discharge Instructions (Signed)
Abscess An abscess (boil or furuncle) is an infected area that contains a collection of pus.  SYMPTOMS Signs and symptoms of an abscess include pain, tenderness, redness, or hardness. You may feel a moveable soft area under your skin. An abscess can occur anywhere in the body.  TREATMENT  A surgical cut (incision) may be made over your abscess to drain the pus. Gauze may be packed into the space or a drain may be looped through the abscess cavity (pocket). This provides a drain that will allow the cavity to heal from the inside outwards. The abscess may be painful for a few days, but should feel much better if it was drained.  Your abscess, if seen early, may not have localized and may not have been drained. If not, another appointment may be required if it does not get better on its own or with medications. HOME CARE INSTRUCTIONS   Only take over-the-counter or prescription medicines for pain, discomfort, or fever as directed by your caregiver.   Take your antibiotics as directed if they were prescribed. Finish them even if you start to feel better.   Keep the skin and clothes clean around your abscess.   If the abscess was drained, you will need to use gauze dressing to collect any draining pus. Dressings will typically need to be changed 3 or more times a day.   The infection may spread by skin contact with others. Avoid skin contact as much as possible.   Practice good hygiene. This includes regular hand washing, cover any draining skin lesions, and do not share personal care items.   If you participate in sports, do not share athletic equipment, towels, whirlpools, or personal care items. Shower after every practice or tournament.   If a draining area cannot be adequately covered:   Do not participate in sports.   Children should not participate in day care until the wound has healed or drainage stops.   If your caregiver has given you a follow-up appointment, it is very important  to keep that appointment. Not keeping the appointment could result in a much worse infection, chronic or permanent injury, pain, and disability. If there is any problem keeping the appointment, you must call back to this facility for assistance.   Shower both sites 2-3 times per day with soap and water, then redress.  If you can keep some gauze wicking in it that will help it heal from the bottom to the top.   SEEK MEDICAL CARE IF:   You develop increased pain, swelling, redness, drainage, or bleeding in the wound site.   You develop signs of generalized infection including muscle aches, chills, fever, or a general ill feeling.   You have an oral temperature above 102 F (38.9 C).  MAKE SURE YOU:   Understand these instructions.   Will watch your condition.   Will get help right away if you are not doing well or get worse.  Document Released: 03/11/2005 Document Revised: 05/21/2011 Document Reviewed: 01/03/2008 [facility] Patient Information 2012 [place], [place].

## 2011-11-17 NOTE — Progress Notes (Signed)
3 Days Post-Op  Subjective:  No complaints of nausea, dizziness today.  Both arm sites really tender today, she didn't seem to have any discomfort with it yesterday.  Objective: Vital signs in last 24 hours: Temp:  [98 F (36.7 C)-98.6 F (37 C)] 98.6 F (37 C) (06/04 0556) Pulse Rate:  [70-89] 70  (06/04 0556) Resp:  [16-20] 16  (06/04 0556) BP: (105-117)/(62-78) 105/71 mmHg (06/04 0556) SpO2:  [99 %-100 %] 99 % (06/04 0556) Last BM Date: 11/13/11  Afebrile, VSS, labs OK yesterday, K+ replaced  Intake/Output from previous day: 06/03 0701 - 06/04 0700 In: 2342.9 [P.O.:120; I.V.:2222.9] Out: 600 [Urine:600] Intake/Output this shift:    General appearance: alert, cooperative and no distress Incision/Wound: Very tender today,  But clean. The left one was sealing nothing in it.  Lab Results:   Basename 11/16/11 1250  WBC 8.1  HGB 13.9  HCT 41.4  PLT 299    BMET  Basename 11/16/11 1250  NA 135  K 3.3*  CL 98  CO2 26  GLUCOSE 109*  BUN 4*  CREATININE 0.67  CALCIUM 9.7   PT/INR No results found for this basename: LABPROT:2,INR:2 in the last 72 hours   Lab 11/16/11 1250  AST 20  ALT 9  ALKPHOS 94  BILITOT 0.2*  PROT 7.9  ALBUMIN 4.1     Lipase  No results found for this basename: lipase     Studies/Results: No results found.  Medications:    . cephALEXin  500 mg Oral Q8H  . lip balm  1 application Topical BID  . ondansetron  6.25 mg Intravenous Once  . potassium chloride  20 mEq Oral BID  . DISCONTD: ondansetron (ZOFRAN) IV  6.25 mg Intravenous Once    Assessment/Plan S/p I and D bilateral axillary abscess, chronic hidradenitis s/p I/D both axilla abscess 11/14/11 Dr. Magnus Ivan  Hidradenitis suppurativa, chronic & recurrent  .  IBS (irritable bowel syndrome)  .  Axillary abscess, right 4x3cm  .  Abscess of left axilla 3x2cm  .  Current smoker  Anxiety/Nausea/Vomiting  Plan:  I redressed each one, I am going to let her take a shower and  let nursing redress..  Follow up in 1-2 weeks with Dr. Magnus Ivan. I do not see orthostatic BP's but she said she walked last Pm with family without difficulty. Send her home with 10 days of Keflex.    LOS: 4 days    Jeffrie Lofstrom 11/17/2011

## 2011-12-01 ENCOUNTER — Encounter (INDEPENDENT_AMBULATORY_CARE_PROVIDER_SITE_OTHER): Payer: Self-pay | Admitting: Surgery

## 2011-12-01 ENCOUNTER — Ambulatory Visit (INDEPENDENT_AMBULATORY_CARE_PROVIDER_SITE_OTHER): Payer: Medicaid Other | Admitting: Surgery

## 2011-12-01 VITALS — BP 126/84 | HR 72 | Temp 97.4°F | Resp 16 | Ht 65.0 in | Wt 138.4 lb

## 2011-12-01 DIAGNOSIS — Z09 Encounter for follow-up examination after completed treatment for conditions other than malignant neoplasm: Secondary | ICD-10-CM

## 2011-12-01 NOTE — Progress Notes (Signed)
Subjective:     Patient ID: Lisa Guerrero, female   DOB: 31-Jul-1985, 26 y.o.   MRN: [id number]  HPI She is here for a postop visit status post incision and drainage of bilateral axillary abscesses and chronic hidradenitis. She reports she is doing well  Review of Systems     Objective:   Physical Exam On exam, but the wounds are very clean with no purulence    Assessment:     Chronic hidradenitis status post incision and drainage    Plan:     I will see her back in 2 weeks and we will discuss definitive surgery

## 2011-12-10 ENCOUNTER — Encounter (INDEPENDENT_AMBULATORY_CARE_PROVIDER_SITE_OTHER): Payer: Medicaid Other | Admitting: General Surgery

## 2011-12-15 ENCOUNTER — Encounter (INDEPENDENT_AMBULATORY_CARE_PROVIDER_SITE_OTHER): Payer: Self-pay | Admitting: Surgery

## 2011-12-15 ENCOUNTER — Ambulatory Visit (INDEPENDENT_AMBULATORY_CARE_PROVIDER_SITE_OTHER): Payer: Medicaid Other | Admitting: Surgery

## 2011-12-15 VITALS — BP 102/64 | HR 76 | Temp 97.3°F | Resp 16 | Ht 65.0 in | Wt 137.4 lb

## 2011-12-15 DIAGNOSIS — L732 Hidradenitis suppurativa: Secondary | ICD-10-CM

## 2011-12-15 DIAGNOSIS — Z01818 Encounter for other preprocedural examination: Secondary | ICD-10-CM

## 2011-12-15 NOTE — Progress Notes (Signed)
Subjective:     Patient ID: Lisa Guerrero, female   DOB: Apr 04, 1986, 26 y.o.   MRN: [id number]  HPI  She is here for another followup of her bilateral axillary hidradenitis. She is currently on Bactrim. She is doing well Review of Systems     Objective:   Physical Exam    there are still chronic abscesses in the axilla which are stable bilaterally Assessment:     Bilateral axillary hidradenitis    Plan:     We will now schedule her for wide excision of the bilateral axillary hidradenitis.

## 2011-12-31 ENCOUNTER — Encounter (HOSPITAL_COMMUNITY)
Admission: RE | Admit: 2011-12-31 | Discharge: 2011-12-31 | Disposition: A | Discharge status: Home / Self Care | Payer: Medicaid Other | Source: Ambulatory Visit | Attending: Surgery | Admitting: Surgery

## 2011-12-31 ENCOUNTER — Encounter (HOSPITAL_COMMUNITY): Payer: Self-pay

## 2011-12-31 ENCOUNTER — Encounter (HOSPITAL_COMMUNITY): Payer: Self-pay | Admitting: Pharmacy Technician

## 2011-12-31 HISTORY — DX: Depression, unspecified: F32.A

## 2011-12-31 HISTORY — DX: Furuncle of buttock: L02.32

## 2011-12-31 HISTORY — DX: Major depressive disorder, single episode, unspecified: F32.9

## 2011-12-31 HISTORY — DX: Anxiety disorder, unspecified: F41.9

## 2011-12-31 LAB — COMPREHENSIVE METABOLIC PANEL
Alkaline Phosphatase: 73 U/L (ref 39–117)
BUN: 8 mg/dL (ref 6–23)
Creatinine, Ser: 0.78 mg/dL (ref 0.50–1.10)
GFR calc Af Amer: >90 mL/min (ref >90)
Glucose, Bld: 88 mg/dL (ref 70–99)
Potassium: 4.2 mEq/L (ref 3.5–5.1)
Total Bilirubin: 0.5 mg/dL (ref 0.3–1.2)
Total Protein: 8.2 g/dL (ref 6.0–8.3)

## 2011-12-31 LAB — CBC
HCT: 41.5 % (ref 36.0–46.0)
Platelets: 260 10*3/uL (ref 150–400)
RBC: 4.49 MIL/uL (ref 3.87–5.11)
RDW: 13.3 % (ref 11.5–15.5)
WBC: 5.4 10*3/uL (ref 4.0–10.5)

## 2011-12-31 NOTE — Pre-Procedure Instructions (Signed)
20 Lisa Guerrero  12/31/2011   Your procedure is scheduled on:  01/07/2012  Report to [facility] at 5:30 AM.  Call this number if you have problems the morning of surgery: [phone]   Remember:   Do not eat food or drink liquid: After Midnight.  Wednesday      Take these medicines the morning of surgery with A SIP OF WATER: Nothing    Do not wear jewelry, make-up or nail polish.  Do not wear lotions, powders, or perfumes. You may wear deodorant.  Do not shave 48 hours prior to surgery. Men may shave face and neck.  Do not bring valuables to the hospital.  Contacts, dentures or bridgework may not be worn into surgery.  Leave suitcase in the car. After surgery it may be brought to your room.  For patients admitted to the hospital, checkout time is 11:00 AM the day of discharge.   Patients discharged the day of surgery will not be allowed to drive home.  Name and phone number of your driver: /w family  Special Instructions: CHG Shower Use Special Wash: 1/2 bottle night before surgery and 1/2 bottle morning of surgery.   Please read over the following fact sheets that you were given: Pain Booklet, Coughing and Deep Breathing, MRSA Information and Surgical Site Infection Prevention
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
## 2012-01-07 ENCOUNTER — Ambulatory Visit (HOSPITAL_COMMUNITY)
Admission: RE | Admit: 2012-01-07 | Discharge: 2012-01-08 | Disposition: A | Discharge status: Home / Self Care | Payer: Medicaid Other | Source: Ambulatory Visit | Attending: Surgery | Admitting: Surgery

## 2012-01-07 ENCOUNTER — Encounter (HOSPITAL_COMMUNITY): Payer: Self-pay

## 2012-01-07 ENCOUNTER — Ambulatory Visit (HOSPITAL_COMMUNITY): Payer: Medicaid Other | Admitting: Anesthesiology

## 2012-01-07 ENCOUNTER — Encounter (HOSPITAL_COMMUNITY)
Admission: RE | Disposition: A | Discharge status: Home / Self Care | Payer: Self-pay | Source: Ambulatory Visit | Attending: Surgery

## 2012-01-07 ENCOUNTER — Encounter (HOSPITAL_COMMUNITY): Payer: Self-pay | Admitting: Anesthesiology

## 2012-01-07 DIAGNOSIS — L732 Hidradenitis suppurativa: Secondary | ICD-10-CM | POA: Insufficient documentation

## 2012-01-07 DIAGNOSIS — F17200 Nicotine dependence, unspecified, uncomplicated: Secondary | ICD-10-CM | POA: Insufficient documentation

## 2012-01-07 HISTORY — DX: Adverse effect of unspecified anesthetic, initial encounter: T41.45XA

## 2012-01-07 HISTORY — DX: Nausea with vomiting, unspecified: Z98.890

## 2012-01-07 HISTORY — PX: HYDRADENITIS EXCISION: SHX5243

## 2012-01-07 HISTORY — DX: Procedure and treatment not carried out because of patient's decision for reasons of belief and group pressure: Z53.1

## 2012-01-07 HISTORY — DX: Reserved for inherently not codable concepts without codable children: IMO0001

## 2012-01-07 HISTORY — DX: Other complications of anesthesia, initial encounter: T88.59XA

## 2012-01-07 HISTORY — DX: Nausea with vomiting, unspecified: R11.2

## 2012-01-07 SURGERY — EXCISION, HIDRADENITIS, AXILLA
Anesthesia: General | Site: Axilla | Laterality: Bilateral | Wound class: Dirty or Infected

## 2012-01-07 MED ORDER — FENTANYL CITRATE 0.05 MG/ML IJ SOLN
INTRAMUSCULAR | Status: DC | PRN
  Administered 2012-01-07: 100 ug via INTRAVENOUS

## 2012-01-07 MED ORDER — OXYCODONE-ACETAMINOPHEN 5-325 MG PO TABS
1.0000 | ORAL_TABLET | ORAL | Status: DC | PRN
  Administered 2012-01-08: 2 via ORAL
  Filled 2012-01-07: qty 2

## 2012-01-07 MED ORDER — HYDROMORPHONE HCL PF 1 MG/ML IJ SOLN
0.2500 mg | INTRAMUSCULAR | Status: DC | PRN
  Administered 2012-01-07 (×2): 0.5 mg via INTRAVENOUS

## 2012-01-07 MED ORDER — EPHEDRINE SULFATE 50 MG/ML IJ SOLN
INTRAMUSCULAR | Status: DC | PRN
  Administered 2012-01-07: 5 mg via INTRAVENOUS

## 2012-01-07 MED ORDER — HYDROMORPHONE HCL PF 1 MG/ML IJ SOLN
1.0000 mg | INTRAMUSCULAR | Status: DC | PRN
  Administered 2012-01-07: 0.5 mg via INTRAVENOUS
  Administered 2012-01-07: 1 mg via INTRAVENOUS
  Filled 2012-01-07 (×2): qty 1

## 2012-01-07 MED ORDER — BUPIVACAINE-EPINEPHRINE 0.5% -1:200000 IJ SOLN
INTRAMUSCULAR | Status: DC | PRN
  Administered 2012-01-07: 20 mL

## 2012-01-07 MED ORDER — PROPOFOL 10 MG/ML IV BOLUS
INTRAVENOUS | Status: DC | PRN
  Administered 2012-01-07: 200 mg via INTRAVENOUS

## 2012-01-07 MED ORDER — 0.9 % SODIUM CHLORIDE (POUR BTL) OPTIME
TOPICAL | Status: DC | PRN
  Administered 2012-01-07: 1000 mL

## 2012-01-07 MED ORDER — POTASSIUM CHLORIDE IN NACL 20-0.9 MEQ/L-% IV SOLN
INTRAVENOUS | Status: DC
  Administered 2012-01-07 – 2012-01-08 (×2): via INTRAVENOUS
  Filled 2012-01-07 (×5): qty 1000

## 2012-01-07 MED ORDER — CEFAZOLIN SODIUM 1-5 GM-% IV SOLN
INTRAVENOUS | Status: DC | PRN
  Administered 2012-01-07: 1 g via INTRAVENOUS

## 2012-01-07 MED ORDER — BUPIVACAINE-EPINEPHRINE (PF) 0.5% -1:200000 IJ SOLN
INTRAMUSCULAR | Status: AC
  Filled 2012-01-07: qty 10

## 2012-01-07 MED ORDER — ONDANSETRON HCL 4 MG/2ML IJ SOLN: 4.0000 mg | Freq: Once | INTRAMUSCULAR | Status: DC | PRN

## 2012-01-07 MED ORDER — ACETAMINOPHEN 10 MG/ML IV SOLN
INTRAVENOUS | Status: AC
  Filled 2012-01-07: qty 100

## 2012-01-07 MED ORDER — ENOXAPARIN SODIUM 40 MG/0.4ML ~~LOC~~ SOLN
40.0000 mg | SUBCUTANEOUS | Status: DC
  Administered 2012-01-08: 40 mg via SUBCUTANEOUS
  Filled 2012-01-07 (×2): qty 0.4

## 2012-01-07 MED ORDER — CEFAZOLIN SODIUM 1-5 GM-% IV SOLN
INTRAVENOUS | Status: AC
  Filled 2012-01-07: qty 50

## 2012-01-07 MED ORDER — MIDAZOLAM HCL 5 MG/5ML IJ SOLN
INTRAMUSCULAR | Status: DC | PRN
  Administered 2012-01-07: 2 mg via INTRAVENOUS

## 2012-01-07 MED ORDER — ONDANSETRON HCL 4 MG PO TABS: 4.0000 mg | ORAL_TABLET | Freq: Four times a day (QID) | ORAL | Status: DC | PRN

## 2012-01-07 MED ORDER — ONDANSETRON HCL 4 MG/2ML IJ SOLN
INTRAMUSCULAR | Status: DC | PRN
  Administered 2012-01-07: 4 mg via INTRAVENOUS

## 2012-01-07 MED ORDER — MORPHINE SULFATE 2 MG/ML IJ SOLN
2.0000 mg | INTRAMUSCULAR | Status: DC | PRN
  Administered 2012-01-08: 2 mg via INTRAVENOUS
  Administered 2012-01-08: 1 mg via INTRAVENOUS
  Filled 2012-01-07 (×2): qty 1

## 2012-01-07 MED ORDER — ACETAMINOPHEN 10 MG/ML IV SOLN
1000.0000 mg | Freq: Once | INTRAVENOUS | Status: AC | PRN
  Administered 2012-01-07: 1000 mg via INTRAVENOUS

## 2012-01-07 MED ORDER — ONDANSETRON HCL 4 MG/2ML IJ SOLN
4.0000 mg | Freq: Four times a day (QID) | INTRAMUSCULAR | Status: DC | PRN
  Administered 2012-01-07: 4 mg via INTRAVENOUS
  Filled 2012-01-07: qty 2

## 2012-01-07 MED ORDER — KETOROLAC TROMETHAMINE 15 MG/ML IJ SOLN
30.0000 mg | Freq: Four times a day (QID) | INTRAMUSCULAR | Status: AC
  Administered 2012-01-07 – 2012-01-08 (×2): 30 mg via INTRAVENOUS
  Filled 2012-01-07 (×2): qty 1
  Filled 2012-01-07: qty 2

## 2012-01-07 MED ORDER — LIDOCAINE HCL (CARDIAC) 20 MG/ML IV SOLN
INTRAVENOUS | Status: DC | PRN
  Administered 2012-01-07: 100 mg via INTRAVENOUS

## 2012-01-07 MED ORDER — LACTATED RINGERS IV SOLN
INTRAVENOUS | Status: DC | PRN
  Administered 2012-01-07: 07:00:00 via INTRAVENOUS

## 2012-01-07 MED ORDER — PROMETHAZINE HCL 25 MG/ML IJ SOLN
25.0000 mg | Freq: Four times a day (QID) | INTRAMUSCULAR | Status: DC | PRN
  Administered 2012-01-07: 12.5 mg via INTRAVENOUS
  Filled 2012-01-07: qty 1

## 2012-01-07 MED ORDER — KETOROLAC TROMETHAMINE 30 MG/ML IJ SOLN
INTRAMUSCULAR | Status: AC
  Filled 2012-01-07: qty 1

## 2012-01-07 SURGICAL SUPPLY — 36 items
BENZOIN TINCTURE PRP APPL 2/3 (GAUZE/BANDAGES/DRESSINGS) ×4 IMPLANT
CANISTER SUCTION 2500CC (MISCELLANEOUS) ×2 IMPLANT
CHLORAPREP W/TINT 26ML (MISCELLANEOUS) ×2 IMPLANT
CLOTH BEACON ORANGE TIMEOUT ST (SAFETY) ×2 IMPLANT
COVER SURGICAL LIGHT HANDLE (MISCELLANEOUS) ×2 IMPLANT
DECANTER SPIKE VIAL GLASS SM (MISCELLANEOUS) ×2 IMPLANT
DRAPE LAPAROSCOPIC ABDOMINAL (DRAPES) IMPLANT
DRAPE PED LAPAROTOMY (DRAPES) ×4 IMPLANT
DRSG PAD ABDOMINAL 8X10 ST (GAUZE/BANDAGES/DRESSINGS) ×8 IMPLANT
ELECT CAUTERY BLADE 6.4 (BLADE) ×2 IMPLANT
ELECT REM PT RETURN 9FT ADLT (ELECTROSURGICAL) ×2
ELECTRODE REM PT RTRN 9FT ADLT (ELECTROSURGICAL) ×1 IMPLANT
GLOVE BIO SURGEON STRL SZ7.5 (GLOVE) ×2 IMPLANT
GLOVE BIOGEL PI IND STRL 7.5 (GLOVE) ×1 IMPLANT
GLOVE BIOGEL PI INDICATOR 7.5 (GLOVE) ×1
GLOVE EXAM NITRILE MD LF STRL (GLOVE) ×2 IMPLANT
GLOVE SURG SIGNA 7.5 PF LTX (GLOVE) ×4 IMPLANT
GOWN PREVENTION PLUS XLARGE (GOWN DISPOSABLE) ×2 IMPLANT
GOWN STRL NON-REIN LRG LVL3 (GOWN DISPOSABLE) ×2 IMPLANT
KIT BASIN OR (CUSTOM PROCEDURE TRAY) ×2 IMPLANT
KIT ROOM TURNOVER OR (KITS) ×2 IMPLANT
NEEDLE HYPO 25GX1X1/2 BEV (NEEDLE) ×2 IMPLANT
NS IRRIG 1000ML POUR BTL (IV SOLUTION) ×2 IMPLANT
PACK GENERAL/GYN (CUSTOM PROCEDURE TRAY) ×2 IMPLANT
PAD ARMBOARD 7.5X6 YLW CONV (MISCELLANEOUS) ×2 IMPLANT
SPECIMEN JAR MEDIUM (MISCELLANEOUS) ×4 IMPLANT
SPONGE GAUZE 4X4 12PLY (GAUZE/BANDAGES/DRESSINGS) ×4 IMPLANT
STRIP CLOSURE SKIN 1/2X4 (GAUZE/BANDAGES/DRESSINGS) ×4 IMPLANT
SUT ETHILON 2 0 FS 18 (SUTURE) ×18 IMPLANT
SUT ETHILON 3 0 FSL (SUTURE) IMPLANT
SUT MNCRL AB 4-0 PS2 18 (SUTURE) ×4 IMPLANT
SUT VIC AB 3-0 SH 18 (SUTURE) ×4 IMPLANT
SYR CONTROL 10ML LL (SYRINGE) ×4 IMPLANT
TAPE CLOTH SURG 4X10 WHT LF (GAUZE/BANDAGES/DRESSINGS) ×2 IMPLANT
TOWEL OR 17X24 6PK STRL BLUE (TOWEL DISPOSABLE) ×2 IMPLANT
TOWEL OR 17X26 10 PK STRL BLUE (TOWEL DISPOSABLE) ×2 IMPLANT

## 2012-01-07 NOTE — Preoperative (Signed)
Beta Blockers   Reason not to administer Beta Blockers:Not Applicable 

## 2012-01-07 NOTE — Transfer of Care (Signed)
Immediate Anesthesia Transfer of Care Note  Patient: Lisa Guerrero  Procedure(s) Performed: Procedure(s) (LRB): EXCISION HYDRADENITIS AXILLA (Bilateral)  Patient Location: PACU  Anesthesia Type: General  Level of Consciousness: awake, alert  and oriented  Airway & Oxygen Therapy: Patient Spontanous Breathing and Patient connected to nasal cannula oxygen  Post-op Assessment: Report given to PACU RN, Post -op Vital signs reviewed and stable and Patient moving all extremities X 4  Post vital signs: Reviewed and stable  Complications: No apparent anesthesia complications

## 2012-01-07 NOTE — Anesthesia Postprocedure Evaluation (Signed)
  Anesthesia Post-op Note  Patient: Lisa Guerrero  Procedure(s) Performed: Procedure(s) (LRB): EXCISION HYDRADENITIS AXILLA (Bilateral)  Patient Location: PACU  Anesthesia Type: General  Level of Consciousness: awake, alert  and oriented  Airway and Oxygen Therapy: Patient Spontanous Breathing  Post-op Pain: none  Post-op Assessment: Post-op Vital signs reviewed and Patient's Cardiovascular Status Stable  Post-op Vital Signs: stable  Complications: No apparent anesthesia complications

## 2012-01-07 NOTE — Op Note (Signed)
EXCISION HYDRADENITIS AXILLA  Procedure Note  Lisa Guerrero 01/07/2012   Pre-op Diagnosis: bilateral axillary hidradenitis     Post-op Diagnosis: same  Procedure(s): 8CM WIDE EXCISION BILATERAL AXILLARY HIDRADENITIS  Surgeon(s): Shelly Rubenstein, MD  Anesthesia: General  Staff:  Sudie Bailey, RN - Circulator Janeece Agee Pingue, CST - Scrub Person Arvil Persons, RN - Circulator Assistant  Estimated Blood Loss: Minimal               Procedure: The patient was brought to the operating room and identified as the correct patient. She was placed supine on the operating room table and general anesthesia was induced. Her axilla were then prepped and draped in the usual sterile fashion. I anesthetized the skin in both axilla with half percent Marcaine with epinephrine. I performed 8 cm wide excisions of all the chronic draining sinus tissue in each of the axilla with the electrocautery. I did distend each axilla with electrocautery and excised all the subcutaneous tissue in the axilla on both sides. I then sent this to pathology for evaluation. I irrigated both wounds with saline. I suture-ligated a small vein in the right axilla. I mobilized the skin medially and laterally with electrocautery on both sides. I then closed both wounds with interrupted 2-0 nylon sutures. Gauze and tape were applied. The patient tolerated the procedure well. All counts were correct at the end of the procedure. The patient was an exudate in the operating room and taken in a stable condition to the recovery room.          Kieon Lawhorn A   Date: 01/07/2012  Time: 8:36 AM

## 2012-01-07 NOTE — Anesthesia Preprocedure Evaluation (Addendum)
Anesthesia Evaluation  Patient identified by MRN, date of birth, ID band Patient awake    Reviewed: Allergy & Precautions, H&P , NPO status , Patient's Chart, lab work & pertinent test results  Airway Mallampati: II      Dental  (+) Teeth Intact   Pulmonary  breath sounds clear to auscultation        Cardiovascular Rhythm:Regular Rate:Normal     Neuro/Psych PSYCHIATRIC DISORDERS Anxiety Depression    GI/Hepatic   Endo/Other    Renal/GU      Musculoskeletal   Abdominal   Peds  Hematology   Anesthesia Other Findings   Reproductive/Obstetrics                          Anesthesia Physical Anesthesia Plan  ASA: I  Anesthesia Plan: General   Post-op Pain Management:    Induction: Intravenous  Airway Management Planned: LMA  Additional Equipment:   Intra-op Plan:   Post-operative Plan: Extubation in OR  Informed Consent: I have reviewed the patients History and Physical, chart, labs and discussed the procedure including the risks, benefits and alternatives for the proposed anesthesia with the patient or authorized representative who has indicated his/her understanding and acceptance.   Dental advisory given  Plan Discussed with: CRNA and Anesthesiologist  Anesthesia Plan Comments:         Anesthesia Quick Evaluation

## 2012-01-07 NOTE — Interval H&P Note (Signed)
History and Physical Interval Note:  No change  01/07/2012 6:59 AM  Lisa Guerrero  has presented today for surgery, with the diagnosis of bilateral axillary hidradenitis  The various methods of treatment have been discussed with the patient and family. After consideration of risks, benefits and other options for treatment, the patient has consented to  Procedure(s) (LRB): EXCISION HYDRADENITIS AXILLA (Bilateral) as a surgical intervention .  The patient's history has been reviewed, patient examined, no change in status, stable for surgery.  I have reviewed the patient's chart and labs.  Questions were answered to the patient's satisfaction.     Amberlee Garvey A

## 2012-01-08 ENCOUNTER — Encounter (HOSPITAL_COMMUNITY): Payer: Self-pay | Admitting: *Deleted

## 2012-01-08 MED ORDER — DOXYCYCLINE HYCLATE 100 MG PO TABS: 100.0000 mg | ORAL_TABLET | Freq: Two times a day (BID) | ORAL | Status: DC

## 2012-01-08 MED ORDER — OXYCODONE-ACETAMINOPHEN 7.5-325 MG PO TABS: 1.0000 | ORAL_TABLET | ORAL | Status: DC | PRN

## 2012-01-08 NOTE — Discharge Summary (Signed)
Physician Discharge Summary  Patient ID: Lisa Guerrero MRN: [id number] DOB/AGE: 1985/09/22 26 y.o.  Admit date: 01/07/2012 Discharge date: 01/08/2012  Admission Diagnoses: axillary hidradenitis  Discharge Diagnoses: same Active Problems:  * No active hospital problems. *    Discharged Condition: good  Hospital Course: admitted s/p surgery.   Discharged home POD#1  Consults: None  Significant Diagnostic Studies:   Treatments: surgery: wide excision bilateral axillary hidradenitis  Discharge Exam: Blood pressure 102/53, pulse 70, temperature 98.4 F (36.9 C), temperature source Oral, resp. rate 16, height 5\' 5"  (1.651 m), weight 138 lb 3.7 oz (62.7 kg), last menstrual period 01/03/2012, SpO2 100.00%. General appearance: alert and no distress Dressings dry, incision clean Disposition: 01-Home or Self Care  Discharge Orders    Future Orders Please Complete By Expires   Discharge patient        Medication List  As of 01/08/2012  7:25 AM   TAKE these medications         acetaminophen 325 MG tablet   Commonly known as: TYLENOL   Take 650 mg by mouth every 6 (six) hours as needed. For temperature >100      doxycycline 100 MG tablet   Commonly known as: VIBRA-TABS   Take 1 tablet (100 mg total) by mouth 2 (two) times daily.      ibuprofen 200 MG tablet   Commonly known as: ADVIL,MOTRIN   Take 600 mg by mouth every 6 (six) hours as needed. For pain.      oxyCODONE-acetaminophen 7.5-325 MG per tablet   Commonly known as: PERCOCET   Take 1 tablet by mouth every 4 (four) hours as needed for pain.      sulfamethoxazole-trimethoprim 800-160 MG per tablet   Commonly known as: BACTRIM DS   Take 1 tablet by mouth 2 (two) times daily as needed. For infections.           Follow-up Information    Follow up with [facility] A, MD. Call on 01/21/2012. (call-- suture removal)    Contact information:   [facility] [address] [place] [address] [phone]          Signed: Shelly Rubenstein 01/08/2012, 7:25 AM

## 2012-01-08 NOTE — Progress Notes (Signed)
Patient ID: Lisa Guerrero, female   DOB: May 26, 1986, 26 y.o.   MRN: [id number] POD#1  Doing well Dressings dry  Plan: discharge

## 2012-01-10 ENCOUNTER — Emergency Department (HOSPITAL_COMMUNITY)
Admission: EM | Admit: 2012-01-10 | Discharge: 2012-01-10 | Disposition: A | Discharge status: Home / Self Care | Payer: Medicaid Other | Attending: Emergency Medicine | Admitting: Emergency Medicine

## 2012-01-10 ENCOUNTER — Encounter (HOSPITAL_COMMUNITY): Payer: Self-pay | Admitting: *Deleted

## 2012-01-10 DIAGNOSIS — L732 Hidradenitis suppurativa: Secondary | ICD-10-CM

## 2012-01-10 DIAGNOSIS — Z9889 Other specified postprocedural states: Secondary | ICD-10-CM | POA: Insufficient documentation

## 2012-01-10 DIAGNOSIS — R112 Nausea with vomiting, unspecified: Secondary | ICD-10-CM | POA: Insufficient documentation

## 2012-01-10 LAB — POCT I-STAT, CHEM 8
BUN: <3 mg/dL — ABNORMAL LOW (ref 6–23)
Calcium, Ion: 1.28 mmol/L — ABNORMAL HIGH (ref 1.12–1.23)
Chloride: 102 mEq/L (ref 96–112)
HCT: 36 % (ref 36.0–46.0)
Potassium: 3.6 mEq/L (ref 3.5–5.1)

## 2012-01-10 MED ORDER — PROMETHAZINE HCL 25 MG/ML IJ SOLN
12.5000 mg | INTRAMUSCULAR | Status: AC
  Administered 2012-01-10: 12.5 mg via INTRAMUSCULAR
  Filled 2012-01-10 (×2): qty 1

## 2012-01-10 MED ORDER — SODIUM CHLORIDE 0.9 % IV BOLUS (SEPSIS): 1000.0000 mL | Freq: Once | INTRAVENOUS | Status: DC

## 2012-01-10 MED ORDER — HYDROCODONE-ACETAMINOPHEN 10-325 MG PO TABS
2.0000 | ORAL_TABLET | ORAL | Status: AC
  Administered 2012-01-10: 2 via ORAL
  Filled 2012-01-10: qty 2

## 2012-01-10 NOTE — ED Provider Notes (Signed)
Dr. Magnus Ivan has seen and evaluate pt and written discharge not appropriately.  Pt was seen by me but i will defer care to Dr. Magnus Ivan.  No H&P performed  Fayrene Helper, PA-C 01/10/12 1428

## 2012-01-10 NOTE — ED Provider Notes (Signed)
Medical screening examination/treatment/procedure(s) were performed by non-physician practitioner and as supervising physician I was immediately available for consultation/collaboration.   Nat Christen, MD 01/10/12 [phone]

## 2012-01-10 NOTE — Consult Note (Signed)
  She is POD#3 s/p excision of chronic hidradenitis bilateral axilla She is complaining of pain and nausea  On exam, incisions look great with no signs of swelling or infection  Plan:  One dose of IM phenergan then will discharge home on vicodin and phernergan

## 2012-01-10 NOTE — ED Notes (Signed)
Reports having surgery on Thursday to have sweat glands removed from under her bilateral arms, reports n/v since surgery, was told to come in due to dehydration. No acute distress noted at triage, no vomiting at this time.

## 2012-01-11 ENCOUNTER — Telehealth (INDEPENDENT_AMBULATORY_CARE_PROVIDER_SITE_OTHER): Payer: Self-pay | Admitting: General Surgery

## 2012-01-11 NOTE — Telephone Encounter (Signed)
Pt called to let us know she went to ER Sunday.  She was still nauseated from anesthesia and could not hold down her pain meds.  While in the ER, Dr. Magnus Ivan (who was on call) removed her dressings and noted two stitches were open and small amount of drainage present.  The phenergan has helped resolve her nausea and she can now tolerate her Percocet.  She is washing, patting dry and covering with gauze because of some bloody drainage.  Pt wants to know if the open incision needs to be re-closed.  Please advise.

## 2012-01-11 NOTE — Telephone Encounter (Signed)
Called pt back and told that we do not re-suture under axilla and I made appt her a appt to see Dr Magnus Ivan on 01-21-12. She stated that what her discharge paper stated and I told her if she needed [place] before then to call back and if Dr Magnus Ivan is not here she can see one of his partners

## 2012-01-21 ENCOUNTER — Encounter (INDEPENDENT_AMBULATORY_CARE_PROVIDER_SITE_OTHER): Payer: Self-pay | Admitting: Surgery

## 2012-01-21 ENCOUNTER — Ambulatory Visit (INDEPENDENT_AMBULATORY_CARE_PROVIDER_SITE_OTHER): Payer: Medicaid Other | Admitting: Surgery

## 2012-01-21 VITALS — BP 116/64 | HR 98 | Temp 98.6°F | Ht 65.0 in | Wt 138.4 lb

## 2012-01-21 DIAGNOSIS — Z09 Encounter for follow-up examination after completed treatment for conditions other than malignant neoplasm: Secondary | ICD-10-CM

## 2012-01-21 NOTE — Progress Notes (Signed)
Subjective:     Patient ID: Lisa Guerrero, female   DOB: 1986/03/14, 26 y.o.   MRN: [id number]  HPI She is here for a postop visit status post wide excision of hidradenitis in both her axilla. She reports minimal drainage  Review of Systems     Objective:   Physical Exam The right axilla shows some skin breakdown so I left the sutures intact. The left side is healing well so I removed every other suture    Assessment:     Patient stable postop    Plan:     I will see her back next week to remove the rest of the sutures. I renewed her hydrocodone. She will continue her Bactrim.

## 2012-01-26 ENCOUNTER — Encounter (INDEPENDENT_AMBULATORY_CARE_PROVIDER_SITE_OTHER): Payer: Self-pay | Admitting: Surgery

## 2012-01-26 ENCOUNTER — Ambulatory Visit (INDEPENDENT_AMBULATORY_CARE_PROVIDER_SITE_OTHER): Payer: Medicaid Other | Admitting: Surgery

## 2012-01-26 VITALS — BP 122/70 | HR 80 | Temp 98.8°F | Resp 14 | Ht 65.0 in | Wt 137.2 lb

## 2012-01-26 DIAGNOSIS — Z09 Encounter for follow-up examination after completed treatment for conditions other than malignant neoplasm: Secondary | ICD-10-CM

## 2012-01-26 NOTE — Progress Notes (Signed)
Subjective:     Patient ID: Lisa Guerrero, female   DOB: 05-Dec-1985, 26 y.o.   MRN: [id number]  HPI She is here for another postop visit. She has no complaints.  Review of Systems     Objective:   Physical Exam Both axilla are healing well except for a small open area in the right axilla. I was able to remove all her sutures.    Assessment:     Patient status post wide excision of hidradenitis in bilateral axilla    Plan:     I will see her back as needed

## 2012-02-18 ENCOUNTER — Telehealth (INDEPENDENT_AMBULATORY_CARE_PROVIDER_SITE_OTHER): Payer: Self-pay

## 2012-02-18 NOTE — Telephone Encounter (Signed)
Dr Magnus Ivan suggests ibuprofen and ice packs. He says it is going to hurt still as it is still healing and he will not be issuing any more narcotics. I called patient back twice to give her this information and her phone went straight to message saying voicemail not set up. If she calls back please give her this information.

## 2012-02-18 NOTE — Telephone Encounter (Signed)
Patient called in with right arm pain. She states it hurts from right axilla all the way down to her forearm. Its worse when she bends to pick her daughter up. She says she has stretched her pain pills out and only takes when she is in severe pain and that she has 2 left. I told her that her doctor will be in office shortly and I would give him the message and see what he suggests for her and I would call her back @ [phone]

## 2012-02-26 ENCOUNTER — Emergency Department (HOSPITAL_BASED_OUTPATIENT_CLINIC_OR_DEPARTMENT_OTHER): Payer: Medicaid Other

## 2012-02-26 ENCOUNTER — Encounter (HOSPITAL_BASED_OUTPATIENT_CLINIC_OR_DEPARTMENT_OTHER): Payer: Self-pay | Admitting: *Deleted

## 2012-02-26 ENCOUNTER — Emergency Department (HOSPITAL_BASED_OUTPATIENT_CLINIC_OR_DEPARTMENT_OTHER)
Admission: EM | Admit: 2012-02-26 | Discharge: 2012-02-26 | Disposition: A | Discharge status: Home / Self Care | Payer: Medicaid Other | Attending: Emergency Medicine | Admitting: Emergency Medicine

## 2012-02-26 DIAGNOSIS — F17200 Nicotine dependence, unspecified, uncomplicated: Secondary | ICD-10-CM | POA: Insufficient documentation

## 2012-02-26 DIAGNOSIS — R05 Cough: Secondary | ICD-10-CM | POA: Insufficient documentation

## 2012-02-26 DIAGNOSIS — J40 Bronchitis, not specified as acute or chronic: Secondary | ICD-10-CM

## 2012-02-26 DIAGNOSIS — Z882 Allergy status to sulfonamides status: Secondary | ICD-10-CM | POA: Insufficient documentation

## 2012-02-26 DIAGNOSIS — R059 Cough, unspecified: Secondary | ICD-10-CM | POA: Insufficient documentation

## 2012-02-26 DIAGNOSIS — R070 Pain in throat: Secondary | ICD-10-CM | POA: Insufficient documentation

## 2012-02-26 DIAGNOSIS — J029 Acute pharyngitis, unspecified: Secondary | ICD-10-CM

## 2012-02-26 MED ORDER — HYDROCODONE-ACETAMINOPHEN 7.5-500 MG/15ML PO SOLN: 15.0000 mL | ORAL | Status: AC | PRN

## 2012-02-26 MED ORDER — AZITHROMYCIN 200 MG/5ML PO SUSR: ORAL | Status: DC

## 2012-02-26 NOTE — ED Notes (Signed)
Sore throat, chills, cough x 2 days.
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
## 2012-02-26 NOTE — ED Provider Notes (Signed)
Medical screening examination/treatment/procedure(s) were performed by non-physician practitioner and as supervising physician I was immediately available for consultation/collaboration.  Gerhard Munch, MD 02/26/12 1530

## 2012-03-01 ENCOUNTER — Encounter (INDEPENDENT_AMBULATORY_CARE_PROVIDER_SITE_OTHER): Payer: Self-pay | Admitting: Surgery

## 2012-03-01 ENCOUNTER — Ambulatory Visit (INDEPENDENT_AMBULATORY_CARE_PROVIDER_SITE_OTHER): Payer: Medicaid Other | Admitting: Surgery

## 2012-03-01 VITALS — BP 112/70 | HR 72 | Temp 97.7°F | Resp 16 | Ht 65.0 in | Wt 136.4 lb

## 2012-03-01 DIAGNOSIS — L732 Hidradenitis suppurativa: Secondary | ICD-10-CM

## 2012-03-01 NOTE — Progress Notes (Signed)
[facility]  Ovidio Kin, MD,  FACS [address] [place]  [address] [place], [place]    [address] Phone:  [phone] FAX:  [phone]   Re:   Lisa Guerrero DOB:   [birth year] MRN:   [id number]  Urgent Office  ASSESSMENT AND PLAN: 1.  Right arm pain - probable inflammation of the lymphatic channels, R>L  Post op excision of bilateral hidradinitis - 01/07/2012 - by Dr. Barrie Dunker  Recommend exercise/stretching both arms, NSAID's for pain and anti-inflammatory effect, quit smoking  She will check with Dr. Magnus Ivan in 4 weeks if she is no better.  2.  Smokes 4 to 5 cigs per day  HISTORY OF PRESENT ILLNESS  Lisa Guerrero is a 26 y.o. (DOB: [birth year])  AA female who is a patient of DORN,HENRY H, MD and comes to Urgent Office today for right arm pain and nodules in upper arms.  She had bilateral axillary hidradenitis excision on 01/07/2012 by Dr. Barrie Dunker.  She has pain in both arms, R>L.  She is using both arms, but still has some limited motion, R>L, particularly in getting her arms over her head.  She is having no fever or drainage.  Social HIstory:  Has two children 55 and 37 years old.  PHYSICAL EXAM: BP 112/70  Pulse 72  Temp 97.7 F (36.5 C)  Resp 16  Ht 5\' 5"  (1.651 m)  Wt 136 lb 6.4 oz (61.871 kg)  BMI 22.70 kg/m2  LMP 02/20/2012  Axilla:  Both axillary wounds (R and L), look okay.  No obvious infection.    But she does have evidence of bilateral lymphatic inflammation.  I think she has palpable lymph nodes in both upper arms (epitrochlear node and a node in the upper medial arm).  I do not think this is an acute infection, but more an inflammation, possibly from interruption of the lymphatics from surgery.  She has good strength in both arms, but has limited ROM (R>L) in putting her arm over her head.  I showed her some exercises she could work on.  DATA REVIEWED: Old notes in Epic  Ovidio Kin, MD, FACS Office:  [phone]

## 2012-03-04 ENCOUNTER — Telehealth (INDEPENDENT_AMBULATORY_CARE_PROVIDER_SITE_OTHER): Payer: Self-pay | Admitting: General Surgery

## 2012-03-04 NOTE — Telephone Encounter (Signed)
Pt called to request a letter from Dr. Magnus Ivan, outlining the dates she has been seen in the office and for surgeries, since November 14, 2011.  She needs it for court.  Pt stated she had "left a voice mail, but didn't nobody call me back."  Explained there was not note in the system to this effect and we don't use voice mail for clinic, but will pass this on.

## 2012-03-07 ENCOUNTER — Encounter (INDEPENDENT_AMBULATORY_CARE_PROVIDER_SITE_OTHER): Payer: Self-pay | Admitting: Surgery

## 2012-03-07 NOTE — Telephone Encounter (Signed)
I called the home # and spoke to the mother.  She states the pt is in court but she will tell her to call me.  We have a letter ready to be picked up at the front desk.

## 2012-04-12 ENCOUNTER — Encounter (INDEPENDENT_AMBULATORY_CARE_PROVIDER_SITE_OTHER): Payer: Medicaid Other | Admitting: Surgery

## 2012-04-13 ENCOUNTER — Emergency Department (HOSPITAL_BASED_OUTPATIENT_CLINIC_OR_DEPARTMENT_OTHER)
Admission: EM | Admit: 2012-04-13 | Discharge: 2012-04-13 | Disposition: A | Discharge status: Home / Self Care | Payer: Medicaid Other | Attending: Emergency Medicine | Admitting: Emergency Medicine

## 2012-04-13 ENCOUNTER — Encounter (HOSPITAL_BASED_OUTPATIENT_CLINIC_OR_DEPARTMENT_OTHER): Payer: Self-pay | Admitting: *Deleted

## 2012-04-13 DIAGNOSIS — F17200 Nicotine dependence, unspecified, uncomplicated: Secondary | ICD-10-CM | POA: Insufficient documentation

## 2012-04-13 DIAGNOSIS — L0291 Cutaneous abscess, unspecified: Secondary | ICD-10-CM

## 2012-04-13 DIAGNOSIS — IMO0002 Reserved for concepts with insufficient information to code with codable children: Secondary | ICD-10-CM | POA: Insufficient documentation

## 2012-04-13 DIAGNOSIS — Z531 Procedure and treatment not carried out because of patient's decision for reasons of belief and group pressure: Secondary | ICD-10-CM | POA: Insufficient documentation

## 2012-04-13 DIAGNOSIS — Z8659 Personal history of other mental and behavioral disorders: Secondary | ICD-10-CM | POA: Insufficient documentation

## 2012-04-13 DIAGNOSIS — K589 Irritable bowel syndrome without diarrhea: Secondary | ICD-10-CM | POA: Insufficient documentation

## 2012-04-13 DIAGNOSIS — Z3202 Encounter for pregnancy test, result negative: Secondary | ICD-10-CM | POA: Insufficient documentation

## 2012-04-13 DIAGNOSIS — Z872 Personal history of diseases of the skin and subcutaneous tissue: Secondary | ICD-10-CM | POA: Insufficient documentation

## 2012-04-13 DIAGNOSIS — Z862 Personal history of diseases of the blood and blood-forming organs and certain disorders involving the immune mechanism: Secondary | ICD-10-CM | POA: Insufficient documentation

## 2012-04-13 DIAGNOSIS — Z9889 Other specified postprocedural states: Secondary | ICD-10-CM | POA: Insufficient documentation

## 2012-04-13 LAB — PREGNANCY, URINE: Preg Test, Ur: POSITIVE — AB

## 2012-04-13 MED ORDER — OXYCODONE-ACETAMINOPHEN 5-325 MG PO TABS: 1.0000 | ORAL_TABLET | Freq: Four times a day (QID) | ORAL | Status: DC | PRN

## 2012-04-13 MED ORDER — CLINDAMYCIN HCL 300 MG PO CAPS: 300.0000 mg | ORAL_CAPSULE | Freq: Four times a day (QID) | ORAL | Status: DC

## 2012-04-13 MED ORDER — CLINDAMYCIN HCL 150 MG PO CAPS
300.0000 mg | ORAL_CAPSULE | Freq: Once | ORAL | Status: AC
  Administered 2012-04-13: 300 mg via ORAL
  Filled 2012-04-13: qty 2

## 2012-04-13 MED ORDER — SULFAMETHOXAZOLE-TRIMETHOPRIM 800-160 MG PO TABS: 2.0000 | ORAL_TABLET | Freq: Two times a day (BID) | ORAL | Status: DC

## 2012-04-13 MED ORDER — SULFAMETHOXAZOLE-TMP DS 800-160 MG PO TABS: 1.0000 | ORAL_TABLET | Freq: Once | ORAL | Status: DC

## 2012-04-13 NOTE — ED Notes (Signed)
MD at bedside. 

## 2012-04-13 NOTE — ED Notes (Signed)
Pt made aware of positive pregnancy results and need for further follow up at OB-GYN.
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
## 2012-04-13 NOTE — ED Notes (Signed)
C/o abscess to left axilla area for few days. Pt states she has had surgery on same area in the past and area is always painful to touch but has worsened recently. Pt denies fever but c/o chills.

## 2012-04-16 ENCOUNTER — Emergency Department (HOSPITAL_BASED_OUTPATIENT_CLINIC_OR_DEPARTMENT_OTHER)
Admission: EM | Admit: 2012-04-16 | Discharge: 2012-04-16 | Disposition: A | Discharge status: Home / Self Care | Payer: Medicaid Other | Attending: Emergency Medicine | Admitting: Emergency Medicine

## 2012-04-16 ENCOUNTER — Encounter (HOSPITAL_BASED_OUTPATIENT_CLINIC_OR_DEPARTMENT_OTHER): Payer: Self-pay | Admitting: *Deleted

## 2012-04-16 DIAGNOSIS — L732 Hidradenitis suppurativa: Secondary | ICD-10-CM

## 2012-04-16 DIAGNOSIS — Z79899 Other long term (current) drug therapy: Secondary | ICD-10-CM | POA: Insufficient documentation

## 2012-04-16 DIAGNOSIS — Z888 Allergy status to other drugs, medicaments and biological substances status: Secondary | ICD-10-CM | POA: Insufficient documentation

## 2012-04-16 DIAGNOSIS — D649 Anemia, unspecified: Secondary | ICD-10-CM | POA: Insufficient documentation

## 2012-04-16 DIAGNOSIS — IMO0002 Reserved for concepts with insufficient information to code with codable children: Secondary | ICD-10-CM | POA: Insufficient documentation

## 2012-04-16 DIAGNOSIS — F411 Generalized anxiety disorder: Secondary | ICD-10-CM | POA: Insufficient documentation

## 2012-04-16 DIAGNOSIS — F17200 Nicotine dependence, unspecified, uncomplicated: Secondary | ICD-10-CM | POA: Insufficient documentation

## 2012-04-16 DIAGNOSIS — L0390 Cellulitis, unspecified: Secondary | ICD-10-CM

## 2012-04-16 DIAGNOSIS — K589 Irritable bowel syndrome without diarrhea: Secondary | ICD-10-CM | POA: Insufficient documentation

## 2012-04-16 MED ORDER — CEPHALEXIN 500 MG PO CAPS: 500.0000 mg | ORAL_CAPSULE | Freq: Four times a day (QID) | ORAL | Status: DC

## 2012-04-16 MED ORDER — CEPHALEXIN 250 MG PO CAPS: 500.0000 mg | ORAL_CAPSULE | Freq: Once | ORAL | Status: DC

## 2012-04-16 NOTE — ED Notes (Signed)
Pt reports that her boil that she was seen for two days ago is back and painful. Pt reports that she called her doctor was told to come to the ER.
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
## 2012-04-16 NOTE — ED Notes (Signed)
Pt has a wound to her left axilla that was previously a boil when she was seen her before.   Pt reports increased pain and drainage today.  Noted upon examination pt has small amount of clear drainage with some purulent drainage mixed in.

## 2012-04-16 NOTE — ED Notes (Signed)
Pt refused to have wound irrigated and refused antibiotics, EDP made aware.

## 2012-04-18 ENCOUNTER — Encounter (INDEPENDENT_AMBULATORY_CARE_PROVIDER_SITE_OTHER): Payer: Self-pay | Admitting: General Surgery

## 2012-04-18 ENCOUNTER — Ambulatory Visit (INDEPENDENT_AMBULATORY_CARE_PROVIDER_SITE_OTHER): Payer: Medicaid Other | Admitting: General Surgery

## 2012-04-18 VITALS — BP 126/62 | HR 64 | Temp 98.7°F | Resp 18 | Ht 65.0 in | Wt 135.0 lb

## 2012-04-18 DIAGNOSIS — IMO0002 Reserved for concepts with insufficient information to code with codable children: Secondary | ICD-10-CM

## 2012-04-18 DIAGNOSIS — L02419 Cutaneous abscess of limb, unspecified: Secondary | ICD-10-CM

## 2012-04-18 MED ORDER — HYDROCODONE-ACETAMINOPHEN 5-325 MG PO TABS: 0.5000 | ORAL_TABLET | Freq: Every evening | ORAL | Status: DC | PRN

## 2012-04-18 NOTE — Progress Notes (Signed)
[facility] [address] [place]  [address] [place], [place]    [address] Phone:  [phone] FAX:  [phone]   Re:   Lisa Guerrero DOB:   1985-07-02 MRN:   [id number]  Urgent Office  ASSESSMENT AND PLAN: Abscess of left axilla 3x2cm Patient is a 26 year old female had a recurrent left axillary abscess in the site of an excision site for chronic hidradenitis. This drained spontaneously.  I will give her 20 Vicodin tablets.  She will be referred back to Dr. Magnus Ivan in 2-4 weeks.  She will need to finish her clindamycin.     HISTORY OF PRESENT ILLNESS  Lisa Guerrero is a 26 y.o. (DOB: 1985-07-03)  AA female who is a patient of DORN,HENRY H, MD and comes to Urgent Office today for left axillary pain.  She had a new abscess that started last week.  She was seen in the ED and given clindamycin.  She started to drain spontaneously.  \  She had bilateral axillary hidradenitis excision on 01/07/2012 by Dr. Barrie Dunker.   She is using both arms, but still has some limited motion on the left, particularly in getting her arms over her head.  She is having no fever or drainage.  Social HIstory:  Has two children 24 and 70 years old.  PHYSICAL EXAM: BP 126/62  Pulse 64  Temp 98.7 F (37.1 C) (Oral)  Resp 18  Ht 5\' 5"  (1.651 m)  Wt 135 lb (61.236 kg)  BMI 22.47 kg/m2  Axilla:  Both axillary wounds (R and L), look okay.  No obvious infection.   There is an open wound 1 cm in diameter in the center of the old wound in the left axilla.  There is no draining purulent discharge.  There is no warmth to skin or erythema.  No evidence of fullness that would be concerning for undrained abscess.   DATA REVIEWED: Old notes in Epic

## 2012-04-18 NOTE — Assessment & Plan Note (Signed)
Patient is a 26 year old female had a recurrent left axillary abscess in the site of an excision site for chronic hidradenitis. This drained spontaneously.  I will give her 20 Vicodin tablets.  She will be referred back to Dr. Magnus Ivan in 2-4 weeks.  She will need to finish her clindamycin.

## 2012-04-18 NOTE — Patient Instructions (Addendum)
Keep wound moist.    Shower daily without dressing.  After around 1 week, would switch to antibiotic ointment for dressing.

## 2012-04-26 ENCOUNTER — Telehealth (INDEPENDENT_AMBULATORY_CARE_PROVIDER_SITE_OTHER): Payer: Self-pay | Admitting: General Surgery

## 2012-04-26 NOTE — Telephone Encounter (Signed)
Pt calling in to report new, painful knot under Rt arm developing now; asking for antibiotics.  Paged and updated Dr. Magnus Ivan.  Ordered Bactrim DS, # 42, 1 po BID x 3 weeks, no refill.  Meds called to [facility]/ HP:  [phone].  Pt will call her OB to make sure OK to take while pregnant.

## 2012-05-16 ENCOUNTER — Encounter (INDEPENDENT_AMBULATORY_CARE_PROVIDER_SITE_OTHER): Payer: Medicaid Other | Admitting: Surgery

## 2012-05-17 ENCOUNTER — Encounter (INDEPENDENT_AMBULATORY_CARE_PROVIDER_SITE_OTHER): Payer: Self-pay | Admitting: Surgery

## 2012-06-16 ENCOUNTER — Encounter (INDEPENDENT_AMBULATORY_CARE_PROVIDER_SITE_OTHER): Payer: Self-pay | Admitting: Surgery

## 2012-09-07 ENCOUNTER — Emergency Department (HOSPITAL_BASED_OUTPATIENT_CLINIC_OR_DEPARTMENT_OTHER): Payer: Medicaid Other

## 2012-09-07 ENCOUNTER — Emergency Department (HOSPITAL_BASED_OUTPATIENT_CLINIC_OR_DEPARTMENT_OTHER)
Admission: EM | Admit: 2012-09-07 | Discharge: 2012-09-07 | Disposition: A | Discharge status: Home / Self Care | Payer: Medicaid Other | Attending: Emergency Medicine | Admitting: Emergency Medicine

## 2012-09-07 ENCOUNTER — Encounter (HOSPITAL_BASED_OUTPATIENT_CLINIC_OR_DEPARTMENT_OTHER): Payer: Self-pay | Admitting: Emergency Medicine

## 2012-09-07 DIAGNOSIS — Z87891 Personal history of nicotine dependence: Secondary | ICD-10-CM | POA: Insufficient documentation

## 2012-09-07 DIAGNOSIS — M545 Low back pain, unspecified: Secondary | ICD-10-CM | POA: Insufficient documentation

## 2012-09-07 DIAGNOSIS — Z8719 Personal history of other diseases of the digestive system: Secondary | ICD-10-CM | POA: Insufficient documentation

## 2012-09-07 DIAGNOSIS — Z872 Personal history of diseases of the skin and subcutaneous tissue: Secondary | ICD-10-CM | POA: Insufficient documentation

## 2012-09-07 DIAGNOSIS — Z3490 Encounter for supervision of normal pregnancy, unspecified, unspecified trimester: Secondary | ICD-10-CM

## 2012-09-07 DIAGNOSIS — O9989 Other specified diseases and conditions complicating pregnancy, childbirth and the puerperium: Secondary | ICD-10-CM | POA: Insufficient documentation

## 2012-09-07 DIAGNOSIS — R51 Headache: Secondary | ICD-10-CM | POA: Insufficient documentation

## 2012-09-07 DIAGNOSIS — Z8659 Personal history of other mental and behavioral disorders: Secondary | ICD-10-CM | POA: Insufficient documentation

## 2012-09-07 DIAGNOSIS — Z862 Personal history of diseases of the blood and blood-forming organs and certain disorders involving the immune mechanism: Secondary | ICD-10-CM | POA: Insufficient documentation

## 2012-09-07 LAB — CBC WITH DIFFERENTIAL/PLATELET
Basophils Relative: 0 % (ref 0–1)
Eosinophils Absolute: 0 10*3/uL (ref 0.0–0.7)
HCT: 40 % (ref 36.0–46.0)
Hemoglobin: 13.5 g/dL (ref 12.0–15.0)
Lymphs Abs: 1.4 10*3/uL (ref 0.7–4.0)
MCH: 31.3 pg (ref 26.0–34.0)
MCHC: 33.8 g/dL (ref 30.0–36.0)
Monocytes Absolute: 0.3 10*3/uL (ref 0.1–1.0)
Monocytes Relative: 6 % (ref 3–12)
Neutrophils Relative %: 67 % (ref 43–77)
RBC: 4.32 MIL/uL (ref 3.87–5.11)

## 2012-09-07 LAB — BASIC METABOLIC PANEL
BUN: 4 mg/dL — ABNORMAL LOW (ref 6–23)
Chloride: 103 mEq/L (ref 96–112)
Creatinine, Ser: 0.7 mg/dL (ref 0.50–1.10)
GFR calc Af Amer: >90 mL/min (ref >90)
GFR calc non Af Amer: >90 mL/min (ref >90)
Glucose, Bld: 107 mg/dL — ABNORMAL HIGH (ref 70–99)
Potassium: 3.7 mEq/L (ref 3.5–5.1)

## 2012-09-07 LAB — URINALYSIS, ROUTINE W REFLEX MICROSCOPIC
Bilirubin Urine: NEGATIVE
Leukocytes, UA: NEGATIVE
Nitrite: NEGATIVE
Specific Gravity, Urine: 1.016 (ref 1.005–1.030)
Urobilinogen, UA: 1 mg/dL (ref 0.0–1.0)
pH: 7.5 (ref 5.0–8.0)

## 2012-09-07 LAB — PREGNANCY, URINE: Preg Test, Ur: POSITIVE — AB

## 2012-09-07 MED ORDER — METRONIDAZOLE 500 MG PO TABS
2000.0000 mg | ORAL_TABLET | Freq: Once | ORAL | Status: AC
  Administered 2012-09-07: 2000 mg via ORAL
  Filled 2012-09-07: qty 4
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
## 2012-09-07 NOTE — ED Notes (Signed)
Abd cramping x 3-4 days.  Seen by PMD for a regular check-up 2-3 weeks ago, checked for STD's and u preg.  All were negative.  N/V x one this week.  No diarrhea.  C/o chills, no fever.

## 2012-09-07 NOTE — ED Provider Notes (Signed)
Medical screening examination/treatment/procedure(s) were performed by non-physician practitioner and as supervising physician I was immediately available for consultation/collaboration.   Dione Booze, MD 09/07/12 [phone]

## 2012-09-07 NOTE — ED Notes (Addendum)
Pt. also reports some back pain and headaches.  Denies urinary sx. or vaginal discharge, reports one episode of mild vaginal bleeding after sex last week.

## 2012-09-08 LAB — GC/CHLAMYDIA PROBE AMP
CT Probe RNA: NEGATIVE
GC Probe RNA: NEGATIVE

## 2012-09-19 ENCOUNTER — Emergency Department (HOSPITAL_BASED_OUTPATIENT_CLINIC_OR_DEPARTMENT_OTHER): Payer: Medicaid Other

## 2012-09-19 ENCOUNTER — Emergency Department (HOSPITAL_BASED_OUTPATIENT_CLINIC_OR_DEPARTMENT_OTHER)
Admission: EM | Admit: 2012-09-19 | Discharge: 2012-09-19 | Disposition: A | Discharge status: Home / Self Care | Payer: Medicaid Other | Attending: Emergency Medicine | Admitting: Emergency Medicine

## 2012-09-19 ENCOUNTER — Encounter (HOSPITAL_BASED_OUTPATIENT_CLINIC_OR_DEPARTMENT_OTHER): Payer: Self-pay | Admitting: *Deleted

## 2012-09-19 DIAGNOSIS — S6000XA Contusion of unspecified finger without damage to nail, initial encounter: Secondary | ICD-10-CM | POA: Insufficient documentation

## 2012-09-19 DIAGNOSIS — Y9289 Other specified places as the place of occurrence of the external cause: Secondary | ICD-10-CM | POA: Insufficient documentation

## 2012-09-19 DIAGNOSIS — W230XXA Caught, crushed, jammed, or pinched between moving objects, initial encounter: Secondary | ICD-10-CM | POA: Insufficient documentation

## 2012-09-19 DIAGNOSIS — Z8659 Personal history of other mental and behavioral disorders: Secondary | ICD-10-CM | POA: Insufficient documentation

## 2012-09-19 DIAGNOSIS — Z872 Personal history of diseases of the skin and subcutaneous tissue: Secondary | ICD-10-CM | POA: Insufficient documentation

## 2012-09-19 DIAGNOSIS — S6010XA Contusion of unspecified finger with damage to nail, initial encounter: Secondary | ICD-10-CM

## 2012-09-19 DIAGNOSIS — Y939 Activity, unspecified: Secondary | ICD-10-CM | POA: Insufficient documentation

## 2012-09-19 DIAGNOSIS — Z862 Personal history of diseases of the blood and blood-forming organs and certain disorders involving the immune mechanism: Secondary | ICD-10-CM | POA: Insufficient documentation

## 2012-09-19 DIAGNOSIS — Z8719 Personal history of other diseases of the digestive system: Secondary | ICD-10-CM | POA: Insufficient documentation

## 2012-09-19 DIAGNOSIS — Z792 Long term (current) use of antibiotics: Secondary | ICD-10-CM | POA: Insufficient documentation

## 2012-09-19 DIAGNOSIS — Z87891 Personal history of nicotine dependence: Secondary | ICD-10-CM | POA: Insufficient documentation

## 2012-09-19 MED ORDER — OXYCODONE-ACETAMINOPHEN 5-325 MG PO TABS
1.0000 | ORAL_TABLET | Freq: Once | ORAL | Status: DC
  Filled 2012-09-19: qty 1

## 2012-09-19 MED ORDER — OXYCODONE-ACETAMINOPHEN 5-325 MG PO TABS
ORAL_TABLET | ORAL | Status: AC
  Administered 2012-09-19: 1
  Filled 2012-09-19: qty 1

## 2012-09-19 MED ORDER — OXYCODONE-ACETAMINOPHEN 5-325 MG PO TABS: 1.0000 | ORAL_TABLET | ORAL | Status: DC | PRN

## 2012-09-19 NOTE — ED Notes (Signed)
Pt c/o 3rd finger right hand injury this am
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
## 2012-12-27 ENCOUNTER — Emergency Department (HOSPITAL_BASED_OUTPATIENT_CLINIC_OR_DEPARTMENT_OTHER)
Admission: EM | Admit: 2012-12-27 | Discharge: 2012-12-27 | Disposition: A | Discharge status: Home / Self Care | Payer: Medicaid Other | Attending: Emergency Medicine | Admitting: Emergency Medicine

## 2012-12-27 ENCOUNTER — Encounter (HOSPITAL_BASED_OUTPATIENT_CLINIC_OR_DEPARTMENT_OTHER): Payer: Self-pay | Admitting: *Deleted

## 2012-12-27 DIAGNOSIS — H1130 Conjunctival hemorrhage, unspecified eye: Secondary | ICD-10-CM | POA: Insufficient documentation

## 2012-12-27 DIAGNOSIS — Z862 Personal history of diseases of the blood and blood-forming organs and certain disorders involving the immune mechanism: Secondary | ICD-10-CM | POA: Insufficient documentation

## 2012-12-27 DIAGNOSIS — Z8719 Personal history of other diseases of the digestive system: Secondary | ICD-10-CM | POA: Insufficient documentation

## 2012-12-27 DIAGNOSIS — Z8739 Personal history of other diseases of the musculoskeletal system and connective tissue: Secondary | ICD-10-CM | POA: Insufficient documentation

## 2012-12-27 DIAGNOSIS — S0003XA Contusion of scalp, initial encounter: Secondary | ICD-10-CM | POA: Insufficient documentation

## 2012-12-27 DIAGNOSIS — S0990XA Unspecified injury of head, initial encounter: Secondary | ICD-10-CM | POA: Insufficient documentation

## 2012-12-27 DIAGNOSIS — S0083XA Contusion of other part of head, initial encounter: Secondary | ICD-10-CM

## 2012-12-27 DIAGNOSIS — R040 Epistaxis: Secondary | ICD-10-CM | POA: Insufficient documentation

## 2012-12-27 DIAGNOSIS — Z87891 Personal history of nicotine dependence: Secondary | ICD-10-CM | POA: Insufficient documentation

## 2012-12-27 DIAGNOSIS — J3489 Other specified disorders of nose and nasal sinuses: Secondary | ICD-10-CM | POA: Insufficient documentation

## 2012-12-27 DIAGNOSIS — Z8659 Personal history of other mental and behavioral disorders: Secondary | ICD-10-CM | POA: Insufficient documentation

## 2012-12-27 DIAGNOSIS — Z872 Personal history of diseases of the skin and subcutaneous tissue: Secondary | ICD-10-CM | POA: Insufficient documentation

## 2012-12-27 DIAGNOSIS — H1132 Conjunctival hemorrhage, left eye: Secondary | ICD-10-CM

## 2012-12-27 DIAGNOSIS — S1093XA Contusion of unspecified part of neck, initial encounter: Secondary | ICD-10-CM | POA: Insufficient documentation

## 2012-12-27 MED ORDER — OXYMETAZOLINE HCL 0.05 % NA SOLN: 2.0000 | Freq: Two times a day (BID) | NASAL | Status: DC

## 2012-12-27 MED ORDER — OXYCODONE-ACETAMINOPHEN 5-325 MG PO TABS: 1.0000 | ORAL_TABLET | ORAL | Status: DC | PRN
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
## 2013-04-21 ENCOUNTER — Encounter (HOSPITAL_BASED_OUTPATIENT_CLINIC_OR_DEPARTMENT_OTHER): Payer: Self-pay | Admitting: Emergency Medicine

## 2013-04-21 ENCOUNTER — Emergency Department (HOSPITAL_BASED_OUTPATIENT_CLINIC_OR_DEPARTMENT_OTHER)
Admission: EM | Admit: 2013-04-21 | Discharge: 2013-04-21 | Disposition: A | Discharge status: Home / Self Care | Payer: Medicaid Other | Attending: Emergency Medicine | Admitting: Emergency Medicine

## 2013-04-21 DIAGNOSIS — S00531A Contusion of lip, initial encounter: Secondary | ICD-10-CM

## 2013-04-21 DIAGNOSIS — Z87891 Personal history of nicotine dependence: Secondary | ICD-10-CM | POA: Insufficient documentation

## 2013-04-21 DIAGNOSIS — S0003XA Contusion of scalp, initial encounter: Secondary | ICD-10-CM | POA: Insufficient documentation

## 2013-04-21 DIAGNOSIS — Z872 Personal history of diseases of the skin and subcutaneous tissue: Secondary | ICD-10-CM | POA: Insufficient documentation

## 2013-04-21 DIAGNOSIS — Z8719 Personal history of other diseases of the digestive system: Secondary | ICD-10-CM | POA: Insufficient documentation

## 2013-04-21 DIAGNOSIS — Z862 Personal history of diseases of the blood and blood-forming organs and certain disorders involving the immune mechanism: Secondary | ICD-10-CM | POA: Insufficient documentation

## 2013-04-21 DIAGNOSIS — S01511A Laceration without foreign body of lip, initial encounter: Secondary | ICD-10-CM

## 2013-04-21 DIAGNOSIS — Z792 Long term (current) use of antibiotics: Secondary | ICD-10-CM | POA: Insufficient documentation

## 2013-04-21 DIAGNOSIS — S01501A Unspecified open wound of lip, initial encounter: Secondary | ICD-10-CM | POA: Insufficient documentation

## 2013-04-21 DIAGNOSIS — Z79899 Other long term (current) drug therapy: Secondary | ICD-10-CM | POA: Insufficient documentation

## 2013-04-21 DIAGNOSIS — Z8659 Personal history of other mental and behavioral disorders: Secondary | ICD-10-CM | POA: Insufficient documentation
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
## 2013-04-21 NOTE — ED Notes (Signed)
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

## 2013-04-21 NOTE — ED Notes (Signed)
[facility] at bs speaking with patient.

## 2013-09-29 ENCOUNTER — Emergency Department (HOSPITAL_BASED_OUTPATIENT_CLINIC_OR_DEPARTMENT_OTHER)
Admission: EM | Admit: 2013-09-29 | Discharge: 2013-09-29 | Disposition: A | Discharge status: Home / Self Care | Payer: Medicaid Other | Attending: Emergency Medicine | Admitting: Emergency Medicine

## 2013-09-29 ENCOUNTER — Encounter (HOSPITAL_BASED_OUTPATIENT_CLINIC_OR_DEPARTMENT_OTHER): Payer: Self-pay | Admitting: Emergency Medicine

## 2013-09-29 DIAGNOSIS — Z8659 Personal history of other mental and behavioral disorders: Secondary | ICD-10-CM | POA: Insufficient documentation

## 2013-09-29 DIAGNOSIS — F17200 Nicotine dependence, unspecified, uncomplicated: Secondary | ICD-10-CM | POA: Insufficient documentation

## 2013-09-29 DIAGNOSIS — Y929 Unspecified place or not applicable: Secondary | ICD-10-CM | POA: Insufficient documentation

## 2013-09-29 DIAGNOSIS — Z872 Personal history of diseases of the skin and subcutaneous tissue: Secondary | ICD-10-CM | POA: Insufficient documentation

## 2013-09-29 DIAGNOSIS — Z79899 Other long term (current) drug therapy: Secondary | ICD-10-CM | POA: Insufficient documentation

## 2013-09-29 DIAGNOSIS — Y939 Activity, unspecified: Secondary | ICD-10-CM | POA: Insufficient documentation

## 2013-09-29 DIAGNOSIS — Z3202 Encounter for pregnancy test, result negative: Secondary | ICD-10-CM | POA: Insufficient documentation

## 2013-09-29 DIAGNOSIS — S335XXA Sprain of ligaments of lumbar spine, initial encounter: Secondary | ICD-10-CM | POA: Insufficient documentation

## 2013-09-29 DIAGNOSIS — Z8719 Personal history of other diseases of the digestive system: Secondary | ICD-10-CM | POA: Insufficient documentation

## 2013-09-29 DIAGNOSIS — S39012A Strain of muscle, fascia and tendon of lower back, initial encounter: Secondary | ICD-10-CM

## 2013-09-29 DIAGNOSIS — L02419 Cutaneous abscess of limb, unspecified: Secondary | ICD-10-CM

## 2013-09-29 DIAGNOSIS — X58XXXA Exposure to other specified factors, initial encounter: Secondary | ICD-10-CM | POA: Insufficient documentation

## 2013-09-29 DIAGNOSIS — Z862 Personal history of diseases of the blood and blood-forming organs and certain disorders involving the immune mechanism: Secondary | ICD-10-CM | POA: Insufficient documentation

## 2013-09-29 DIAGNOSIS — Z792 Long term (current) use of antibiotics: Secondary | ICD-10-CM | POA: Insufficient documentation

## 2013-09-29 LAB — URINE MICROSCOPIC-ADD ON

## 2013-09-29 LAB — URINALYSIS, ROUTINE W REFLEX MICROSCOPIC
Bilirubin Urine: NEGATIVE
GLUCOSE, UA: NEGATIVE mg/dL
HGB URINE DIPSTICK: NEGATIVE
Ketones, ur: NEGATIVE mg/dL
Nitrite: NEGATIVE
PH: 8 (ref 5.0–8.0)
Protein, ur: NEGATIVE mg/dL
SPECIFIC GRAVITY, URINE: 1.024 (ref 1.005–1.030)
Urobilinogen, UA: 1 mg/dL (ref 0.0–1.0)

## 2013-09-29 LAB — PREGNANCY, URINE: PREG TEST UR: NEGATIVE

## 2013-09-29 MED ORDER — HYDROCODONE-ACETAMINOPHEN 5-325 MG PO TABS: 0.5000 | ORAL_TABLET | Freq: Every evening | ORAL | Status: DC | PRN

## 2013-09-29 MED ORDER — METHOCARBAMOL 500 MG PO TABS: 500.0000 mg | ORAL_TABLET | Freq: Two times a day (BID) | ORAL | Status: DC

## 2013-09-29 MED ORDER — METHYLPREDNISOLONE SODIUM SUCC 125 MG IJ SOLR
125.0000 mg | Freq: Once | INTRAMUSCULAR | Status: AC
  Administered 2013-09-29: 125 mg via INTRAMUSCULAR
  Filled 2013-09-29: qty 2
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
## 2013-09-29 NOTE — ED Provider Notes (Signed)
Medical screening examination/treatment/procedure(s) were performed by non-physician practitioner and as supervising physician I was immediately available for consultation/collaboration.   EKG Interpretation None        Dagmar HaitWilliam Reine Bristow, MD 09/29/13 2037

## 2013-09-29 NOTE — Discharge Instructions (Signed)
Back Exercises °Back exercises help treat and prevent back injuries. The goal of back exercises is to increase the strength of your abdominal and back muscles and the flexibility of your back. These exercises should be started when you no longer have back pain. Back exercises include: °· Pelvic Tilt. Lie on your back with your knees bent. Tilt your pelvis until the lower part of your back is against the floor. Hold this position 5 to 10 sec and repeat 5 to 10 times. °· Knee to Chest. Pull first 1 knee up against your chest and hold for 20 to 30 seconds, repeat this with the other knee, and then both knees. This may be done with the other leg straight or bent, whichever feels better. °· Sit-Ups or Curl-Ups. Bend your knees 90 degrees. Start with tilting your pelvis, and do a partial, slow sit-up, lifting your trunk only 30 to 45 degrees off the floor. Take at least 2 to 3 seconds for each sit-up. Do not do sit-ups with your knees out straight. If partial sit-ups are difficult, simply do the above but with only tightening your abdominal muscles and holding it as directed. °· Hip-Lift. Lie on your back with your knees flexed 90 degrees. Push down with your feet and shoulders as you raise your hips a couple inches off the floor; hold for 10 seconds, repeat 5 to 10 times. °· Back arches. Lie on your stomach, propping yourself up on bent elbows. Slowly press on your hands, causing an arch in your low back. Repeat 3 to 5 times. Any initial stiffness and discomfort should lessen with repetition over time. °· Shoulder-Lifts. Lie face down with arms beside your body. Keep hips and torso pressed to floor as you slowly lift your head and shoulders off the floor. °Do not overdo your exercises, especially in the beginning. Exercises may cause you some mild back discomfort which lasts for a few minutes; however, if the pain is more severe, or lasts for more than 15 minutes, do not continue exercises until you see your caregiver.  Improvement with exercise therapy for back problems is slow.  °See your caregivers for assistance with developing a proper back exercise program. °Document Released: 07/09/2004 Document Revised: 08/24/2011 Document Reviewed: 04/02/2011 °ExitCare® Patient Information ©2014 [employer]. ° °Back Pain, Adult °Low back pain is very common. About 1 in 5 people have back pain. The cause of low back pain is rarely dangerous. The pain often gets better over time. About half of people with a sudden onset of back pain feel better in just 2 weeks. About 8 in 10 people feel better by 6 weeks.  °CAUSES °Some common causes of back pain include: °· Strain of the muscles or ligaments supporting the spine. °· Wear and tear (degeneration) of the spinal discs. °· Arthritis. °· Direct injury to the back. °DIAGNOSIS °Most of the time, the direct cause of low back pain is not known. However, back pain can be treated effectively even when the exact cause of the pain is unknown. Answering your caregiver's questions about your overall health and symptoms is one of the most accurate ways to make sure the cause of your pain is not dangerous. If your caregiver needs more information, he or she may order lab work or imaging tests (X-rays or MRIs). However, even if imaging tests show changes in your back, this usually does not require surgery. °HOME CARE INSTRUCTIONS °For many people, back pain returns. Since low back pain is rarely dangerous, it is often a condition that people   can learn to manage on their own.  °· Remain active. It is stressful on the back to sit or stand in one place. Do not sit, drive, or stand in one place for more than 30 minutes at a time. Take short walks on level surfaces as soon as pain allows. Try to increase the length of time you walk each day. °· Do not stay in bed. Resting more than 1 or 2 days can delay your recovery. °· Do not avoid exercise or work. Your body is made to move. It is not dangerous to be active,  even though your back may hurt. Your back will likely heal faster if you return to being active before your pain is gone. °· Pay attention to your body when you  bend and lift. Many people have less discomfort when lifting if they bend their knees, keep the load close to their bodies, and avoid twisting. Often, the most comfortable positions are those that put less stress on your recovering back. °· Find a comfortable position to sleep. Use a firm mattress and lie on your side with your knees slightly bent. If you lie on your back, put a pillow under your knees. °· Only take over-the-counter or prescription medicines as directed by your caregiver. Over-the-counter medicines to reduce pain and inflammation are often the most helpful. Your caregiver may prescribe muscle relaxant drugs. These medicines help dull your pain so you can more quickly return to your normal activities and healthy exercise. °· Put ice on the injured area. °· Put ice in a plastic bag. °· Place a towel between your skin and the bag. °· Leave the ice on for 15-20 minutes, 03-04 times a day for the first 2 to 3 days. After that, ice and heat may be alternated to reduce pain and spasms. °· Ask your caregiver about trying back exercises and gentle massage. This may be of some benefit. °· Avoid feeling anxious or stressed. Stress increases muscle tension and can worsen back pain. It is important to recognize when you are anxious or stressed and learn ways to manage it. Exercise is a great option. °SEEK MEDICAL CARE IF: °· You have pain that is not relieved with rest or medicine. °· You have pain that does not improve in 1 week. °· You have new symptoms. °· You are generally not feeling well. °SEEK IMMEDIATE MEDICAL CARE IF:  °· You have pain that radiates from your back into your legs. °· You develop new bowel or bladder control problems. °· You have unusual weakness or numbness in your arms or legs. °· You develop nausea or vomiting. °· You develop  abdominal pain. °· You feel faint. °Document Released: 06/01/2005 Document Revised: 12/01/2011 Document Reviewed: 10/20/2010 °ExitCare® Patient Information ©2014 [employer]. ° °

## 2013-09-29 NOTE — ED Notes (Signed)
Lower back pain-started last week-denies injury
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
## 2013-10-18 ENCOUNTER — Emergency Department (HOSPITAL_BASED_OUTPATIENT_CLINIC_OR_DEPARTMENT_OTHER)
Admission: EM | Admit: 2013-10-18 | Discharge: 2013-10-18 | Disposition: A | Discharge status: Home / Self Care | Payer: Medicaid Other | Attending: Emergency Medicine | Admitting: Emergency Medicine

## 2013-10-18 ENCOUNTER — Emergency Department (HOSPITAL_BASED_OUTPATIENT_CLINIC_OR_DEPARTMENT_OTHER): Payer: Medicaid Other

## 2013-10-18 ENCOUNTER — Encounter (HOSPITAL_BASED_OUTPATIENT_CLINIC_OR_DEPARTMENT_OTHER): Payer: Self-pay | Admitting: Emergency Medicine

## 2013-10-18 DIAGNOSIS — R109 Unspecified abdominal pain: Secondary | ICD-10-CM

## 2013-10-18 DIAGNOSIS — Z8719 Personal history of other diseases of the digestive system: Secondary | ICD-10-CM | POA: Insufficient documentation

## 2013-10-18 DIAGNOSIS — Z862 Personal history of diseases of the blood and blood-forming organs and certain disorders involving the immune mechanism: Secondary | ICD-10-CM | POA: Insufficient documentation

## 2013-10-18 DIAGNOSIS — Z79899 Other long term (current) drug therapy: Secondary | ICD-10-CM | POA: Insufficient documentation

## 2013-10-18 DIAGNOSIS — Z8659 Personal history of other mental and behavioral disorders: Secondary | ICD-10-CM | POA: Insufficient documentation

## 2013-10-18 DIAGNOSIS — Z23 Encounter for immunization: Secondary | ICD-10-CM | POA: Insufficient documentation

## 2013-10-18 DIAGNOSIS — F17200 Nicotine dependence, unspecified, uncomplicated: Secondary | ICD-10-CM | POA: Insufficient documentation

## 2013-10-18 DIAGNOSIS — Z792 Long term (current) use of antibiotics: Secondary | ICD-10-CM | POA: Insufficient documentation

## 2013-10-18 DIAGNOSIS — Z872 Personal history of diseases of the skin and subcutaneous tissue: Secondary | ICD-10-CM | POA: Insufficient documentation

## 2013-10-18 DIAGNOSIS — R1032 Left lower quadrant pain: Secondary | ICD-10-CM | POA: Insufficient documentation

## 2013-10-18 LAB — PREGNANCY, URINE: Preg Test, Ur: NEGATIVE

## 2013-10-18 LAB — URINALYSIS, ROUTINE W REFLEX MICROSCOPIC
BILIRUBIN URINE: NEGATIVE
Glucose, UA: NEGATIVE mg/dL
HGB URINE DIPSTICK: NEGATIVE
KETONES UR: NEGATIVE mg/dL
Nitrite: NEGATIVE
PROTEIN: NEGATIVE mg/dL
SPECIFIC GRAVITY, URINE: 1.026 (ref 1.005–1.030)
UROBILINOGEN UA: 0.2 mg/dL (ref 0.0–1.0)
pH: 5.5 (ref 5.0–8.0)

## 2013-10-18 LAB — URINE MICROSCOPIC-ADD ON

## 2013-10-18 MED ORDER — OXYCODONE-ACETAMINOPHEN 5-325 MG PO TABS: 1.0000 | ORAL_TABLET | Freq: Four times a day (QID) | ORAL | Status: DC | PRN

## 2013-10-18 MED ORDER — FENTANYL CITRATE 0.05 MG/ML IJ SOLN
100.0000 ug | Freq: Once | INTRAMUSCULAR | Status: AC
  Administered 2013-10-18: 100 ug via INTRAVENOUS
  Filled 2013-10-18: qty 2

## 2013-10-18 MED ORDER — SODIUM CHLORIDE 0.9 % IV SOLN
INTRAVENOUS | Status: DC
  Administered 2013-10-18: 05:00:00 via INTRAVENOUS

## 2013-10-18 MED ORDER — OXYCODONE-ACETAMINOPHEN 5-325 MG PO TABS
1.0000 | ORAL_TABLET | Freq: Once | ORAL | Status: AC
  Administered 2013-10-18: 1 via ORAL
  Filled 2013-10-18: qty 1

## 2013-10-18 MED ORDER — ONDANSETRON HCL 4 MG/2ML IJ SOLN
4.0000 mg | Freq: Once | INTRAMUSCULAR | Status: AC
  Administered 2013-10-18: 4 mg via INTRAVENOUS
  Filled 2013-10-18: qty 2

## 2013-10-18 NOTE — ED Notes (Signed)
Pt has called family for ride- waiting in room for their arrival- rx x 1 given for percocet

## 2013-10-18 NOTE — ED Notes (Signed)
Pt reports left flank pain that is worse with inspiration
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
## 2013-10-18 NOTE — ED Notes (Signed)
Patient transported to CT 

## 2013-10-22 ENCOUNTER — Emergency Department (HOSPITAL_BASED_OUTPATIENT_CLINIC_OR_DEPARTMENT_OTHER)
Admission: EM | Admit: 2013-10-22 | Discharge: 2013-10-22 | Disposition: A | Discharge status: Home / Self Care | Payer: Medicaid Other | Attending: Emergency Medicine | Admitting: Emergency Medicine

## 2013-10-22 ENCOUNTER — Encounter (HOSPITAL_BASED_OUTPATIENT_CLINIC_OR_DEPARTMENT_OTHER): Payer: Self-pay | Admitting: Emergency Medicine

## 2013-10-22 DIAGNOSIS — R112 Nausea with vomiting, unspecified: Secondary | ICD-10-CM | POA: Insufficient documentation

## 2013-10-22 DIAGNOSIS — Z8659 Personal history of other mental and behavioral disorders: Secondary | ICD-10-CM | POA: Insufficient documentation

## 2013-10-22 DIAGNOSIS — R109 Unspecified abdominal pain: Secondary | ICD-10-CM | POA: Diagnosis present

## 2013-10-22 DIAGNOSIS — R1012 Left upper quadrant pain: Secondary | ICD-10-CM | POA: Insufficient documentation

## 2013-10-22 DIAGNOSIS — F17200 Nicotine dependence, unspecified, uncomplicated: Secondary | ICD-10-CM | POA: Insufficient documentation

## 2013-10-22 DIAGNOSIS — Z3202 Encounter for pregnancy test, result negative: Secondary | ICD-10-CM | POA: Insufficient documentation

## 2013-10-22 DIAGNOSIS — Z8719 Personal history of other diseases of the digestive system: Secondary | ICD-10-CM | POA: Insufficient documentation

## 2013-10-22 DIAGNOSIS — Z862 Personal history of diseases of the blood and blood-forming organs and certain disorders involving the immune mechanism: Secondary | ICD-10-CM | POA: Insufficient documentation

## 2013-10-22 DIAGNOSIS — Z872 Personal history of diseases of the skin and subcutaneous tissue: Secondary | ICD-10-CM | POA: Insufficient documentation

## 2013-10-22 DIAGNOSIS — R42 Dizziness and giddiness: Secondary | ICD-10-CM | POA: Insufficient documentation

## 2013-10-22 LAB — CBC WITH DIFFERENTIAL/PLATELET
BASOS ABS: 0 10*3/uL (ref 0.0–0.1)
BASOS PCT: 0 % (ref 0–1)
Eosinophils Absolute: 0.1 10*3/uL (ref 0.0–0.7)
Eosinophils Relative: 2 % (ref 0–5)
HCT: 41.1 % (ref 36.0–46.0)
HEMOGLOBIN: 13.8 g/dL (ref 12.0–15.0)
Lymphocytes Relative: 42 % (ref 12–46)
Lymphs Abs: 1.6 10*3/uL (ref 0.7–4.0)
MCH: 31.7 pg (ref 26.0–34.0)
MCHC: 33.6 g/dL (ref 30.0–36.0)
MCV: 94.5 fL (ref 78.0–100.0)
MONOS PCT: 9 % (ref 3–12)
Monocytes Absolute: 0.3 10*3/uL (ref 0.1–1.0)
NEUTROS ABS: 1.8 10*3/uL (ref 1.7–7.7)
NEUTROS PCT: 46 % (ref 43–77)
Platelets: DECREASED 10*3/uL (ref 150–400)
RBC: 4.35 MIL/uL (ref 3.87–5.11)
RDW: 12.3 % (ref 11.5–15.5)
WBC: 3.8 10*3/uL — ABNORMAL LOW (ref 4.0–10.5)

## 2013-10-22 LAB — URINALYSIS, ROUTINE W REFLEX MICROSCOPIC
BILIRUBIN URINE: NEGATIVE
GLUCOSE, UA: NEGATIVE mg/dL
KETONES UR: NEGATIVE mg/dL
Nitrite: NEGATIVE
PH: 6.5 (ref 5.0–8.0)
Protein, ur: NEGATIVE mg/dL
Specific Gravity, Urine: 1.018 (ref 1.005–1.030)
Urobilinogen, UA: 1 mg/dL (ref 0.0–1.0)

## 2013-10-22 LAB — URINE MICROSCOPIC-ADD ON

## 2013-10-22 LAB — COMPREHENSIVE METABOLIC PANEL
ALBUMIN: 4.3 g/dL (ref 3.5–5.2)
ALK PHOS: 61 U/L (ref 39–117)
ALT: 9 U/L (ref 0–35)
AST: 20 U/L (ref 0–37)
BILIRUBIN TOTAL: 0.3 mg/dL (ref 0.3–1.2)
BUN: 6 mg/dL (ref 6–23)
CHLORIDE: 101 meq/L (ref 96–112)
CO2: 26 mEq/L (ref 19–32)
Calcium: 9.5 mg/dL (ref 8.4–10.5)
Creatinine, Ser: 0.7 mg/dL (ref 0.50–1.10)
GFR calc Af Amer: >90 mL/min (ref >90)
GFR calc non Af Amer: >90 mL/min (ref >90)
Glucose, Bld: 112 mg/dL — ABNORMAL HIGH (ref 70–99)
POTASSIUM: 4.1 meq/L (ref 3.7–5.3)
Sodium: 138 mEq/L (ref 137–147)
Total Protein: 7.9 g/dL (ref 6.0–8.3)

## 2013-10-22 LAB — LIPASE, BLOOD: Lipase: 17 U/L (ref 11–59)

## 2013-10-22 LAB — PREGNANCY, URINE: Preg Test, Ur: NEGATIVE

## 2013-10-22 MED ORDER — MORPHINE SULFATE 4 MG/ML IJ SOLN
4.0000 mg | INTRAMUSCULAR | Status: AC
Start: 2013-10-22 — End: 2013-10-22
  Administered 2013-10-22: 4 mg via INTRAVENOUS
  Filled 2013-10-22: qty 1

## 2013-10-22 MED ORDER — HYDROCODONE-ACETAMINOPHEN 5-325 MG PO TABS: 1.0000 | ORAL_TABLET | Freq: Four times a day (QID) | ORAL | Status: DC | PRN

## 2013-10-22 MED ORDER — ONDANSETRON HCL 4 MG/2ML IJ SOLN
4.0000 mg | Freq: Once | INTRAMUSCULAR | Status: AC
  Administered 2013-10-22: 4 mg via INTRAVENOUS
  Filled 2013-10-22: qty 2

## 2013-10-22 MED ORDER — SODIUM CHLORIDE 0.9 % IV BOLUS (SEPSIS)
1000.0000 mL | INTRAVENOUS | Status: AC
  Administered 2013-10-22: 1000 mL via INTRAVENOUS

## 2013-10-22 MED ORDER — ONDANSETRON 4 MG PO TBDP: ORAL_TABLET | ORAL | Status: DC
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
## 2013-10-22 NOTE — Discharge Instructions (Signed)
Abdominal Pain, Women °Abdominal (stomach, pelvic, or belly) pain can be caused by many things. It is important to tell your doctor: °· The location of the pain. °· Does it come and go or is it present all the time? °· Are there things that start the pain (eating certain foods, exercise)? °· Are there other symptoms associated with the pain (fever, nausea, vomiting, diarrhea)? °All of this is helpful to know when trying to find the cause of the pain. °CAUSES  °· Stomach: virus or bacteria infection, or ulcer. °· Intestine: appendicitis (inflamed appendix), regional ileitis (Crohn's disease), ulcerative colitis (inflamed colon), irritable bowel syndrome, diverticulitis (inflamed diverticulum of the colon), or cancer of the stomach or intestine. °· Gallbladder disease or stones in the gallbladder. °· Kidney disease, kidney stones, or infection. °· Pancreas infection or cancer. °· Fibromyalgia (pain disorder). °· Diseases of the female organs: °· Uterus: fibroid (non-cancerous) tumors or infection. °· Fallopian tubes: infection or tubal pregnancy. °· Ovary: cysts or tumors. °· Pelvic adhesions (scar tissue). °· Endometriosis (uterus lining tissue growing in the pelvis and on the pelvic organs). °· Pelvic congestion syndrome (female organs filling up with blood just before the menstrual period). °· Pain with the menstrual period. °· Pain with ovulation (producing an egg). °· Pain with an IUD (intrauterine device, birth control) in the uterus. °· Cancer of the female organs. °· Functional pain (pain not caused by a disease, may improve without treatment). °· Psychological pain. °· Depression. °DIAGNOSIS  °Your doctor will decide the seriousness of your pain by doing an examination. °· Blood tests. °· X-rays. °· Ultrasound. °· CT scan (computed tomography, special type of X-ray). °· MRI (magnetic resonance imaging). °· Cultures, for infection. °· Barium enema (dye inserted in the large intestine, to better view it with  X-rays). °· Colonoscopy (looking in intestine with a lighted tube). °· Laparoscopy (minor surgery, looking in abdomen with a lighted tube). °· Major abdominal exploratory surgery (looking in abdomen with a large incision). °TREATMENT  °The treatment will depend on the cause of the pain.  °· Many cases can be observed and treated at home. °· Over-the-counter medicines recommended by your caregiver. °· Prescription medicine. °· Antibiotics, for infection. °· Birth control pills, for painful periods or for ovulation pain. °· Hormone treatment, for endometriosis. °· Nerve blocking injections. °· Physical therapy. °· Antidepressants. °· Counseling with a psychologist or psychiatrist. °· Minor or major surgery. °HOME CARE INSTRUCTIONS  °· Do not take laxatives, unless directed by your caregiver. °· Take over-the-counter pain medicine only if ordered by your caregiver. Do not take aspirin because it can cause an upset stomach or bleeding. °· Try a clear liquid diet (broth or water) as ordered by your caregiver. Slowly move to a bland diet, as tolerated, if the pain is related to the stomach or intestine. °· Have a thermometer and take your temperature several times a day, and record it. °· Bed rest and sleep, if it helps the pain. °· Avoid sexual intercourse, if it causes pain. °· Avoid stressful situations. °· Keep your follow-up appointments and tests, as your caregiver orders. °· If the pain does not go away with medicine or surgery, you may try: °· Acupuncture. °· Relaxation exercises (yoga, meditation). °· Group therapy. °· Counseling. °SEEK MEDICAL CARE IF:  °· You notice certain foods cause stomach pain. °· Your home care treatment is not helping your pain. °· You need stronger pain medicine. °· You want your IUD removed. °· You feel faint or   lightheaded. °· You develop nausea and vomiting. °· You develop a rash. °· You are having side effects or an allergy to your medicine. °SEEK IMMEDIATE MEDICAL CARE IF:  °· Your  pain does not go away or gets worse. °· You have a fever. °· Your pain is felt only in portions of the abdomen. The right side could possibly be appendicitis. The left lower portion of the abdomen could be colitis or diverticulitis. °· You are passing blood in your stools (bright red or black tarry stools, with or without vomiting). °· You have blood in your urine. °· You develop chills, with or without a fever. °· You pass out. °MAKE SURE YOU:  °· Understand these instructions. °· Will watch your condition. °· Will get help right away if you are not doing well or get worse. °Document Released: 03/29/2007 Document Revised: 08/24/2011 Document Reviewed: 04/18/2009 °ExitCare® Patient Information ©2014 [employer]. ° °Emergency Department Resource Guide °1) Find a Doctor and Pay Out of Pocket °Although you won't have to find out who is covered by your insurance plan, it is a good idea to ask around and get recommendations. You will then need to call the office and see if the doctor you have chosen will accept you as a new patient and what types of options they offer for patients who are self-pay. Some doctors offer discounts or will set up payment plans for their patients who do not have insurance, but you will need to ask so you aren't surprised when you get to your appointment. ° °2) Contact Your Local Health Department °Not all health departments have doctors that can see patients for sick visits, but many do, so it is worth a call to see if yours does. If you don't know where your local health department is, you can check in your phone book. The CDC also has a tool to help you locate your state's health department, and many state websites also have listings of all of their local health departments. ° °3) Find a Walk-in Clinic °If your illness is not likely to be very severe or complicated, you may want to try a walk in clinic. These are popping up all over the country in pharmacies, drugstores, and shopping  centers. They're usually staffed by nurse practitioners or physician assistants that have been trained to treat common illnesses and complaints. They're usually fairly quick and inexpensive. However, if you have serious medical issues or chronic medical problems, these are probably not your best option. ° °No Primary Care Doctor: °- Call [facility] at  832-8000 - they can help you locate a primary care doctor that  accepts your insurance, provides certain services, etc. °- Physician Referral Service- 1-800-533-3463 ° °Chronic Pain Problems: °Organization         Address  Phone   Notes  °[facility]  (336) 297-2271 Patients need to be referred by their primary care doctor.  ° °Medication Assistance: °Organization         Address  Phone   Notes  °[place] [facility] [address] °[place], [place] [address] (336) 641-8030 --Must be a resident of [place] °-- Must have NO insurance coverage whatsoever (no Medicaid/ Medicare, etc.) °-- The pt. MUST have a primary care doctor that directs their care regularly and follows them in the community °  °[facility]  (866) 331-1348   °[employer]  (888) 892-1162   ° °Agencies that provide inexpensive medical care: °Organization           Address  Phone   Notes  °[facility]  (336) 832-8035   °[facility]    (336) 832-7272   °[facility] [address] °[place], [place] [address] (336) 832-4777   °[facility] [address], °[place] (336) 271-4999   °[facility]    (336) 373-0678   °[facility]    (336) 272-1050   °[facility] ° [address], [place] Phone:  (336) 832-4444, Fax:  (336) 832-4440 Hours of Operation:  9 am - 6 pm, M-F.  Also accepts Medicaid/Medicare and self-pay.  °[facility] ° [address], [place] Phone: (336) 832-3150, Fax: (336)  832-3151. Hours of Operation:  8:30 am - 5:30 pm, M-F.  Also accepts Medicaid and self-pay.  °[facility] [place] [address], [place] Phone: (336) 878-6027   °[facility] [address], [place], [place] (336)723-1848, Ext. 123 Mondays & Thursdays: 7-9 AM.  First 15 patients are seen on a first come, first serve basis. °  ° °Medicaid-accepting [place] Providers: ° °Organization         Address  Phone   Notes  °[facility] [address], [place] (336) 641-2100 Also accepts self-pay patients.  °[facility] [address], [place] ° (336) 856-9996   °[facility] [address], [place] (336) 288-8857   °[facility] [address] [place] [address], [place] (336) 299-7000   °Veita Bland [address], [place]  ° (336) 373-1557 Only accepts [place] Medicaid patients after they have their name applied to their card.  ° °Self-Pay (no insurance) in [place]: ° °Organization         Address  Phone   Notes  °Sickle Cell Patients, [facility] [address], [place] (336) 832-1970   °[facility] [address], [place] (336) 832-4400   °[facility] ° [address], [place] (336) 992-4800   °[facility]/Dr. Osei-Bonsu ° [address] [place] [address], [place] or [address], [place] (336) [phone] Phone number for both [place] and [place] locations is the same.  °[facility] [address], [place] (336) 299-0000   °[facility] [address] [place] [address], [place] or [address] (336) 852-7530 °(336) 878-2260   °[facility] [address], [place] (336) 350-1642, phone; (336) 294-5005, fax Sees patients 1st and 3rd Saturday of every month.  Must not qualify for public or private insurance (i.e.  Medicaid, Medicare, [place] Health Choice, Veterans' Benefits) • Household income should be no more than 200% of the poverty level •The clinic cannot treat you if you are pregnant or think you are pregnant • Sexually transmitted diseases are not treated at the clinic.  ° ° °Dental Care: °Organization         Address  Phone  Notes  °[facility] [address], [place] (336) 641-6152 Accepts children up to age 21 who are enrolled in Medicaid or [place] Health Choice; pregnant women with a Medicaid card; and children who have applied for Medicaid or  Health Choice, but were declined, whose parents can pay a reduced fee at time   of service.  °[facility] [place]  [address], [place] (336) 641-7733 Accepts children up to age 21 who are enrolled in Medicaid or [place] Health Choice; pregnant women with a Medicaid card; and children who have applied for Medicaid or [place] Health Choice, but were declined, whose parents can pay a reduced fee at time of service.  °[facility] ° [address], [place] (336) 641-4533 Patients are seen by appointment only. Walk-ins are not accepted. [facility] will see patients 18 years of age and older. °Monday - Tuesday (8am-5pm) °Most Wednesdays (8:30-5pm) °$30 per visit, cash only  °[facility] ° [address], [place] (336) 641-4533 Patients are seen by appointment only. Walk-ins are not accepted. [facility] will see patients 18 years of age and older. °One Wednesday Evening (Monthly: Volunteer Based).  $30 per visit, cash only  °[facility]  (919) 537-3737 for adults; Children under age 4, call Graduate Pediatric Dentistry at ([phone]) [phone]. Children aged 4-14, please call ([phone]) [phone] to request a pediatric application. ° Dental services are provided in all areas of dental care including fillings,  crowns and bridges, complete and partial dentures, implants, gum treatment, root canals, and extractions. Preventive care is also provided. Treatment is provided to both adults and children. °Patients are selected via a lottery and there is often a waiting list. °  °[facility] [address], °[place] ° (336) 763-8833 [email or web address] °  °[facility] [address], [place], [place] (336)723-1848, Ext. 123 Second and Fourth Thursday of each month, opens at 6:30 AM; Clinic ends at 9 AM.  Patients are seen on a first-come first-served basis, and a limited number are seen during each clinic.  ° °[facility] ° [address], [place], [place] (336) 723-7904   Eligibility Requirements °You must have lived in [place], [place], or [place] counties for at least the last three months. °  You cannot be eligible for state or federal sponsored healthcare insurance, including Veterans Administration, Medicaid, or Medicare. °  You generally cannot be eligible for healthcare insurance through your employer.  °  How to apply: °Eligibility screenings are held every Tuesday and Wednesday afternoon from 1:00 pm until 4:00 pm. You do not need an appointment for the interview!  °[facility] [address], [place], [place] 336-631-2330   °[facility]  336-342-8273   °[facility]  336-703-3100   °[facility]  336-570-6415   ° °Behavioral Health Resources in the Community: °Intensive Outpatient Programs °Organization         Address  Phone  Notes  °[facility] [address], [place], [place] 336-878-6098   °[facility] [address], [place], [place] 336-832-9800   °[facility] [address], [place], [place] ° 336-882-2125   °[facility] [address],  °,  1-800-853-5163 or 336-641-4981   °Substance Abuse  Resources °Organization         Address  Phone  Notes  °[facility]  336-882-2125   °[facility]  336-784-9470   °[facility]  336-285-9073   °[facility]  336-845-3988   °Residential & Outpatient Substance Abuse Program  1-800-659-3381   °Psychological Services °Organization         Address    Phone  Notes  °[facility]  336- 832-9600   °[facility]  336- 378-7881   °[facility] [address], [place] 1-800-853-5163 or 336-641-4981   ° °Mobile Crisis Teams °Organization         Address  Phone  Notes  °[facility], Mobile Crisis Care Unit  1-877-626-1772   °Assertive °[facility] ° [address] [place], [place] 336-834-9664   °Sharon DeEsch [address] °[place] 336-554-5454   ° °Self-Help/Support Groups °Organization         Address  Phone             Notes  °[facility] - variety of support groups  336- 373-1402 Call for more information  °Narcotics Anonymous (NA), [facility] [address], °[place]  2 meetings at this location  ° °Residential Treatment Programs °Organization         Address  Phone  Notes  °[facility] [address],    °[place]  1-866-801-8205   °[facility] ° [address], [place], [place] 704-293-8524   °[facility] [address], [place] 336-845-3988 Admissions: 8am-3pm M-F  °[facility] [address]    °[place], [place] 336-841-1104   °[facility] [address], [place], [place] 336-379-7146   °[facility] [address]  °[place], [place] 336-285-9073   °[facility] - Intensive Outpatient [address], [place], [place] 336-852-3033   °[facility] [address]  °[place], [place] 1-877-615-2722 or 336-784-9470   °[facility] [address] [place], [place] 336-227-7417 Accepts Medicaid  °[facility] [address]  °[place] 1-800-659-3381 Substance Abuse/Addiction Treatment  ° °[place] Behavioral Health Resources °Organization         Address  Phone  Notes  °[facility]  (888) 581-9988   °Julie Brannon, PhD [address] [place], [place]   (336) 349-5553 or (336) 951-0000   °[facility]   [address] °[place], [place] (336) 349-4454   °[facility] [address], [place], [place] (336) 342-8316 Insurance/Medicaid/sponsorship through [facility]  °[facility] [address]                                    [place], [place] (336) 342-8316 Therapy/tele-psych/case  °[facility] [address]  ° [place], [place] (336) 349-2233    °Dr. Arfeen  (336) 349-4544   °[facility] [place]  [employer] [place] [facility] 1) [address], [place] °2) [address], [place] °3)  [address], [place] (336) 349-3220 °(336) 342-7768 ° °(336) 342-8140   °[place] [facility] (336) 342-1394 or (336) 342-3537 (After Hours)    ° ° ° °

## 2014-04-16 ENCOUNTER — Encounter (HOSPITAL_BASED_OUTPATIENT_CLINIC_OR_DEPARTMENT_OTHER): Payer: Self-pay | Admitting: Emergency Medicine

## 2014-04-17 ENCOUNTER — Encounter (HOSPITAL_BASED_OUTPATIENT_CLINIC_OR_DEPARTMENT_OTHER): Payer: Self-pay | Admitting: *Deleted

## 2014-04-17 ENCOUNTER — Emergency Department (HOSPITAL_BASED_OUTPATIENT_CLINIC_OR_DEPARTMENT_OTHER)
Admission: EM | Admit: 2014-04-17 | Discharge: 2014-04-17 | Disposition: A | Discharge status: Home / Self Care | Payer: Medicaid Other | Attending: Emergency Medicine | Admitting: Emergency Medicine

## 2014-04-17 ENCOUNTER — Emergency Department (HOSPITAL_BASED_OUTPATIENT_CLINIC_OR_DEPARTMENT_OTHER): Payer: Medicaid Other

## 2014-04-17 DIAGNOSIS — R109 Unspecified abdominal pain: Secondary | ICD-10-CM

## 2014-04-17 DIAGNOSIS — Z3A01 Less than 8 weeks gestation of pregnancy: Secondary | ICD-10-CM | POA: Insufficient documentation

## 2014-04-17 DIAGNOSIS — O99333 Smoking (tobacco) complicating pregnancy, third trimester: Secondary | ICD-10-CM | POA: Diagnosis not present

## 2014-04-17 DIAGNOSIS — F17210 Nicotine dependence, cigarettes, uncomplicated: Secondary | ICD-10-CM | POA: Diagnosis not present

## 2014-04-17 DIAGNOSIS — R1031 Right lower quadrant pain: Secondary | ICD-10-CM | POA: Diagnosis not present

## 2014-04-17 DIAGNOSIS — Z8719 Personal history of other diseases of the digestive system: Secondary | ICD-10-CM | POA: Diagnosis not present

## 2014-04-17 DIAGNOSIS — O9989 Other specified diseases and conditions complicating pregnancy, childbirth and the puerperium: Secondary | ICD-10-CM | POA: Insufficient documentation

## 2014-04-17 DIAGNOSIS — Z8659 Personal history of other mental and behavioral disorders: Secondary | ICD-10-CM | POA: Insufficient documentation

## 2014-04-17 DIAGNOSIS — Z872 Personal history of diseases of the skin and subcutaneous tissue: Secondary | ICD-10-CM | POA: Diagnosis not present

## 2014-04-17 DIAGNOSIS — R1032 Left lower quadrant pain: Secondary | ICD-10-CM | POA: Insufficient documentation

## 2014-04-17 DIAGNOSIS — O26899 Other specified pregnancy related conditions, unspecified trimester: Secondary | ICD-10-CM

## 2014-04-17 LAB — URINALYSIS, ROUTINE W REFLEX MICROSCOPIC
BILIRUBIN URINE: NEGATIVE
Glucose, UA: NEGATIVE mg/dL
Hgb urine dipstick: NEGATIVE
Ketones, ur: NEGATIVE mg/dL
Leukocytes, UA: NEGATIVE
NITRITE: NEGATIVE
Protein, ur: NEGATIVE mg/dL
Specific Gravity, Urine: 1.021 (ref 1.005–1.030)
UROBILINOGEN UA: 1 mg/dL (ref 0.0–1.0)
pH: 6 (ref 5.0–8.0)

## 2014-04-17 LAB — CBC WITH DIFFERENTIAL/PLATELET
BASOS ABS: 0 10*3/uL (ref 0.0–0.1)
Basophils Relative: 0 % (ref 0–1)
EOS ABS: 0 10*3/uL (ref 0.0–0.7)
EOS PCT: 1 % (ref 0–5)
HEMATOCRIT: 36.9 % (ref 36.0–46.0)
Hemoglobin: 12.5 g/dL (ref 12.0–15.0)
Lymphocytes Relative: 36 % (ref 12–46)
Lymphs Abs: 1.8 10*3/uL (ref 0.7–4.0)
MCH: 31.3 pg (ref 26.0–34.0)
MCHC: 33.9 g/dL (ref 30.0–36.0)
MCV: 92.5 fL (ref 78.0–100.0)
MONO ABS: 0.4 10*3/uL (ref 0.1–1.0)
Monocytes Relative: 7 % (ref 3–12)
Neutro Abs: 2.8 10*3/uL (ref 1.7–7.7)
Neutrophils Relative %: 56 % (ref 43–77)
Platelets: 247 10*3/uL (ref 150–400)
RBC: 3.99 MIL/uL (ref 3.87–5.11)
RDW: 11.9 % (ref 11.5–15.5)
WBC: 5 10*3/uL (ref 4.0–10.5)

## 2014-04-17 LAB — WET PREP, GENITAL
TRICH WET PREP: NONE SEEN
Yeast Wet Prep HPF POC: NONE SEEN

## 2014-04-17 LAB — ABO/RH: ABO/RH(D): O POS

## 2014-04-17 LAB — RPR

## 2014-04-17 LAB — HIV ANTIBODY (ROUTINE TESTING W REFLEX): HIV: NONREACTIVE

## 2014-04-17 LAB — HCG, QUANTITATIVE, PREGNANCY: hCG, Beta Chain, Quant, S: 10763 m[IU]/mL — ABNORMAL HIGH (ref <5)

## 2014-04-17 LAB — PREGNANCY, URINE: PREG TEST UR: POSITIVE — AB

## 2014-04-17 NOTE — ED Notes (Signed)
Pt c/o lower abd pain x 1 day denies n/v/d
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
## 2014-04-18 LAB — GC/CHLAMYDIA PROBE AMP
CT Probe RNA: NEGATIVE
GC Probe RNA: NEGATIVE

## 2014-04-24 ENCOUNTER — Telehealth (HOSPITAL_BASED_OUTPATIENT_CLINIC_OR_DEPARTMENT_OTHER): Payer: Self-pay | Admitting: Emergency Medicine

## 2014-05-04 ENCOUNTER — Emergency Department (HOSPITAL_BASED_OUTPATIENT_CLINIC_OR_DEPARTMENT_OTHER)
Admission: EM | Admit: 2014-05-04 | Discharge: 2014-05-04 | Disposition: A | Discharge status: Home / Self Care | Payer: Medicaid Other | Attending: Emergency Medicine | Admitting: Emergency Medicine

## 2014-05-04 ENCOUNTER — Encounter (HOSPITAL_BASED_OUTPATIENT_CLINIC_OR_DEPARTMENT_OTHER): Payer: Self-pay | Admitting: *Deleted

## 2014-05-04 DIAGNOSIS — Z720 Tobacco use: Secondary | ICD-10-CM | POA: Insufficient documentation

## 2014-05-04 DIAGNOSIS — B349 Viral infection, unspecified: Secondary | ICD-10-CM

## 2014-05-04 DIAGNOSIS — H9209 Otalgia, unspecified ear: Secondary | ICD-10-CM | POA: Diagnosis not present

## 2014-05-04 DIAGNOSIS — Z8659 Personal history of other mental and behavioral disorders: Secondary | ICD-10-CM | POA: Insufficient documentation

## 2014-05-04 DIAGNOSIS — M791 Myalgia: Secondary | ICD-10-CM | POA: Diagnosis not present

## 2014-05-04 DIAGNOSIS — Z872 Personal history of diseases of the skin and subcutaneous tissue: Secondary | ICD-10-CM | POA: Diagnosis not present

## 2014-05-04 DIAGNOSIS — Z862 Personal history of diseases of the blood and blood-forming organs and certain disorders involving the immune mechanism: Secondary | ICD-10-CM | POA: Diagnosis not present

## 2014-05-04 DIAGNOSIS — R509 Fever, unspecified: Secondary | ICD-10-CM | POA: Diagnosis present

## 2014-05-04 MED ORDER — OSELTAMIVIR PHOSPHATE 75 MG PO CAPS: 75.0000 mg | ORAL_CAPSULE | Freq: Two times a day (BID) | ORAL | Status: DC

## 2014-05-04 NOTE — ED Notes (Signed)
Pt. Reports flu like symptoms.  Pt. Reports she has sore lymph nodes in her neck.
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
## 2014-05-04 NOTE — ED Notes (Signed)
Pt mom called in quite upset because the pt was given a prescription for tamaflu, she thinks she needs a zpak.  I explained to mom that the doctor chose the meds and she felt that this might help her.  She wanted more info but I explained the pt was 28 I really could not give her any more info but certainly her daughter could.  She said she was going to call her and tell her not to leave.

## 2014-05-04 NOTE — ED Notes (Signed)
Spoke with the patient and mother and explained the plan of care. The patient chose to leave after giving d/c instructions.

## 2014-05-05 ENCOUNTER — Encounter (HOSPITAL_BASED_OUTPATIENT_CLINIC_OR_DEPARTMENT_OTHER): Payer: Self-pay | Admitting: Emergency Medicine

## 2014-05-05 NOTE — ED Notes (Signed)
Talked with pt and explained that the doctor did not feel she had anything bacterial, all viral.  She said "I want something for this pain and to help me rest.  I can take damn tylenol at home".  I explained that" when you're pregnant there isn't a lot you can take and you don't want to hurt your baby".  She said "I really don't care.  I already have two at home.  I haven't been able to rest all week with them running in and out and always needing something." "I'm not leaving til I get something else."  "I explained that we would not do anything to hurt her baby even if she didn't care"  She then said "well, where's my discharge papers?"

## 2016-04-12 ENCOUNTER — Encounter (HOSPITAL_BASED_OUTPATIENT_CLINIC_OR_DEPARTMENT_OTHER): Payer: Self-pay | Admitting: *Deleted

## 2016-04-12 ENCOUNTER — Emergency Department (HOSPITAL_BASED_OUTPATIENT_CLINIC_OR_DEPARTMENT_OTHER)
Admission: EM | Admit: 2016-04-12 | Discharge: 2016-04-12 | Disposition: A | Discharge status: Home / Self Care | Payer: Medicaid Other | Attending: Dermatology | Admitting: Dermatology

## 2016-04-12 DIAGNOSIS — Z87891 Personal history of nicotine dependence: Secondary | ICD-10-CM | POA: Insufficient documentation

## 2016-04-12 DIAGNOSIS — Z3A16 16 weeks gestation of pregnancy: Secondary | ICD-10-CM | POA: Insufficient documentation

## 2016-04-12 DIAGNOSIS — M79604 Pain in right leg: Secondary | ICD-10-CM | POA: Insufficient documentation

## 2016-04-12 DIAGNOSIS — O26892 Other specified pregnancy related conditions, second trimester: Secondary | ICD-10-CM | POA: Diagnosis present

## 2016-04-12 DIAGNOSIS — Z5321 Procedure and treatment not carried out due to patient leaving prior to being seen by health care provider: Secondary | ICD-10-CM | POA: Diagnosis not present

## 2016-04-12 DIAGNOSIS — M79605 Pain in left leg: Secondary | ICD-10-CM | POA: Diagnosis not present

## 2016-04-12 NOTE — ED Triage Notes (Signed)
Pt c/o bil leg pain only at night x 7 days pt is 16 weeks preg

## 2016-04-13 ENCOUNTER — Emergency Department (HOSPITAL_BASED_OUTPATIENT_CLINIC_OR_DEPARTMENT_OTHER)
Admission: EM | Admit: 2016-04-13 | Discharge: 2016-04-13 | Disposition: A | Discharge status: Home / Self Care | Payer: Medicaid Other | Attending: Emergency Medicine | Admitting: Emergency Medicine

## 2016-04-13 ENCOUNTER — Emergency Department (HOSPITAL_BASED_OUTPATIENT_CLINIC_OR_DEPARTMENT_OTHER): Payer: Medicaid Other

## 2016-04-13 ENCOUNTER — Encounter (HOSPITAL_BASED_OUTPATIENT_CLINIC_OR_DEPARTMENT_OTHER): Payer: Self-pay | Admitting: Emergency Medicine

## 2016-04-13 DIAGNOSIS — M79606 Pain in leg, unspecified: Secondary | ICD-10-CM | POA: Diagnosis not present

## 2016-04-13 DIAGNOSIS — Z87891 Personal history of nicotine dependence: Secondary | ICD-10-CM | POA: Insufficient documentation

## 2016-04-13 DIAGNOSIS — Z3A16 16 weeks gestation of pregnancy: Secondary | ICD-10-CM | POA: Insufficient documentation

## 2016-04-13 DIAGNOSIS — R252 Cramp and spasm: Secondary | ICD-10-CM

## 2016-04-13 DIAGNOSIS — O9989 Other specified diseases and conditions complicating pregnancy, childbirth and the puerperium: Secondary | ICD-10-CM | POA: Diagnosis present

## 2016-04-13 DIAGNOSIS — O99891 Other specified diseases and conditions complicating pregnancy: Secondary | ICD-10-CM

## 2016-04-13 LAB — BASIC METABOLIC PANEL
Anion gap: 6 (ref 5–15)
BUN: <5 mg/dL — ABNORMAL LOW (ref 6–20)
CO2: 23 mmol/L (ref 22–32)
CREATININE: 0.44 mg/dL (ref 0.44–1.00)
Calcium: 9 mg/dL (ref 8.9–10.3)
Chloride: 104 mmol/L (ref 101–111)
GFR calc Af Amer: >60 mL/min (ref >60)
Glucose, Bld: 77 mg/dL (ref 65–99)
POTASSIUM: 3.6 mmol/L (ref 3.5–5.1)
SODIUM: 133 mmol/L — AB (ref 135–145)

## 2016-04-13 MED ORDER — ACETAMINOPHEN 325 MG PO TABS
ORAL_TABLET | ORAL | Status: AC
  Filled 2016-04-13: qty 2

## 2016-04-13 MED ORDER — CHEWABLES MULTIVITAMIN PO CHEW: 1.0000 | CHEWABLE_TABLET | Freq: Every day | ORAL | 6 refills | Status: DC

## 2016-04-13 MED ORDER — ACETAMINOPHEN 500 MG PO TABS: 1000.0000 mg | ORAL_TABLET | Freq: Once | ORAL | Status: DC

## 2016-04-13 MED ORDER — ACETAMINOPHEN 325 MG PO TABS
650.0000 mg | ORAL_TABLET | Freq: Once | ORAL | Status: AC
  Administered 2016-04-13: 650 mg via ORAL
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
## 2016-04-13 NOTE — Discharge Instructions (Signed)
Read the information below.  You may return to the Emergency Department at any time for worsening condition or any new symptoms that concern you. Drink plenty of fluids and take a daily multivitamin (or prenatal vitamin if you can tolerate it).  Take tylenol as needed for pain.  See you obstetrician as soon as possible for a regular prenatal visit and discuss your leg pain with him at the time.

## 2016-04-13 NOTE — ED Triage Notes (Signed)
Pt having intermittent leg pain x one week.  Pt also having intermittent chest pain since 2015.  Pt speaking full sentences.  No acute distress noted.

## 2016-06-15 NOTE — L&D Delivery Note (Signed)
31 y.o. G4P2 at [redacted]w[redacted]d admitted to L&D with [facility] and delivered a viable female infant in cephalic, LOA position at 0302 am. No nuchal cord.  Cord clamped x2 and cut after 60 sec delay. Placenta delivered spontaneously intact, with 3VC. Fundus firm on exam with massage and pitocin. Good hemostasis noted.  Anesthesia: Epidural Laceration: none Suture: N/A Good hemostasis noted. EBL: 150 cc  Mom and baby recovering in LDR.    Apgars: APGAR (1 MIN): 8   APGAR (5 MINS): 9   Weight: Pending skin to skin.    Candelaria Stagers, MD.  PGY-2, [facility] Pager [phone] 09/21/16  3:21 AM

## 2016-07-20 ENCOUNTER — Encounter (HOSPITAL_BASED_OUTPATIENT_CLINIC_OR_DEPARTMENT_OTHER): Payer: Self-pay

## 2016-07-20 ENCOUNTER — Emergency Department (HOSPITAL_BASED_OUTPATIENT_CLINIC_OR_DEPARTMENT_OTHER)
Admission: EM | Admit: 2016-07-20 | Discharge: 2016-07-20 | Disposition: A | Discharge status: Home / Self Care | Payer: Medicaid Other | Attending: Emergency Medicine | Admitting: Emergency Medicine

## 2016-07-20 DIAGNOSIS — K0889 Other specified disorders of teeth and supporting structures: Secondary | ICD-10-CM | POA: Diagnosis present

## 2016-07-20 DIAGNOSIS — K029 Dental caries, unspecified: Secondary | ICD-10-CM | POA: Insufficient documentation

## 2016-07-20 DIAGNOSIS — Z87891 Personal history of nicotine dependence: Secondary | ICD-10-CM | POA: Insufficient documentation

## 2016-07-20 MED ORDER — PENICILLIN V POTASSIUM 250 MG PO TABS
500.0000 mg | ORAL_TABLET | Freq: Once | ORAL | Status: AC
  Administered 2016-07-20: 500 mg via ORAL
  Filled 2016-07-20: qty 2

## 2016-07-20 MED ORDER — ACETAMINOPHEN 500 MG PO TABS
1000.0000 mg | ORAL_TABLET | Freq: Once | ORAL | Status: AC
  Administered 2016-07-20: 1000 mg via ORAL
  Filled 2016-07-20: qty 2

## 2016-07-20 MED ORDER — PENICILLIN V POTASSIUM 500 MG PO TABS: 500.0000 mg | ORAL_TABLET | Freq: Three times a day (TID) | ORAL | 0 refills | Status: DC

## 2016-07-20 NOTE — Discharge Instructions (Signed)
Please read and follow all provided instructions.  Your diagnoses today include:  1. Pain, dental     The exam and treatment you received today has been provided on an emergency basis only. This is not a substitute for complete medical or dental care.  Tests performed today include:  Vital signs. See below for your results today.   Medications prescribed:   Penicillin - antibiotic  You have been prescribed an antibiotic medicine: take the entire course of medicine even if you are feeling better. Stopping early can cause the antibiotic not to work.  Take any prescribed medications only as directed.  Home care instructions:  Follow any educational materials contained in this packet.  Follow-up instructions: Please follow-up with your dentist for further evaluation of your symptoms.   Dental Assistance: See below for dental referrals  Return instructions:   Please return to the Emergency Department if you experience worsening symptoms.  Please return if you develop a fever, you develop more swelling in your face or neck, you have trouble breathing or swallowing food.  Please return if you have any other emergent concerns.  Additional Information:  Your vital signs today were: BP 104/71 (BP Location: Right Arm)    Pulse 105    Temp 98 F (36.7 C) (Oral)    Resp 16    Ht 5\' 5"  (1.651 m)    Wt 77.1 kg    LMP 12/17/2015    SpO2 96%    BMI 28.29 kg/m  If your blood pressure (BP) was elevated above 135/85 this visit, please have this repeated by your doctor within one month. --------------

## 2016-07-20 NOTE — ED Triage Notes (Signed)
Reports cracked left lower molar causing severe dental pain.  Reports it is radiating up into ear and down neck.  Patient is 7 months pregnant.
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
## 2016-08-16 ENCOUNTER — Emergency Department (HOSPITAL_BASED_OUTPATIENT_CLINIC_OR_DEPARTMENT_OTHER)
Admission: EM | Admit: 2016-08-16 | Discharge: 2016-08-16 | Payer: Medicaid Other | Attending: Physician Assistant | Admitting: Physician Assistant

## 2016-08-16 ENCOUNTER — Encounter (HOSPITAL_BASED_OUTPATIENT_CLINIC_OR_DEPARTMENT_OTHER): Payer: Self-pay | Admitting: Emergency Medicine

## 2016-08-16 DIAGNOSIS — Z3A35 35 weeks gestation of pregnancy: Secondary | ICD-10-CM | POA: Insufficient documentation

## 2016-08-16 DIAGNOSIS — L02224 Furuncle of groin: Secondary | ICD-10-CM | POA: Insufficient documentation

## 2016-08-16 DIAGNOSIS — O26893 Other specified pregnancy related conditions, third trimester: Secondary | ICD-10-CM | POA: Diagnosis present

## 2016-08-16 DIAGNOSIS — Z87891 Personal history of nicotine dependence: Secondary | ICD-10-CM | POA: Insufficient documentation

## 2016-08-16 DIAGNOSIS — O99713 Diseases of the skin and subcutaneous tissue complicating pregnancy, third trimester: Secondary | ICD-10-CM | POA: Insufficient documentation

## 2016-08-16 DIAGNOSIS — N898 Other specified noninflammatory disorders of vagina: Secondary | ICD-10-CM | POA: Diagnosis not present

## 2016-08-16 NOTE — Progress Notes (Signed)
[facility]. Pt is a G4P2 at 34 5/[redacted] weeks gestation with c/o vaginal pain since Friday. ? Abscess to labia with yellow drainage. Pt  Says that she was hospitalized on Wed for PTL and was d/c on Friday. Says her cervix was not dilated and she was given procardia while in the hospital. She was not given any meds upon d/c. No vaginal bleeding or leaking of fluid. [facility].
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
## 2016-08-16 NOTE — Progress Notes (Signed)
Spoke with Dr. Alysia PennaErvin. Pt is a G4P2 at 34 5/[redacted] weeks gestation with c/o vaginal d/c and vaginal pain. Pt says she was admitted to the hospital on Wed with preterm contractions and was d/c on Friday. Says she was given procardia while in the hospital, but was not d/c with any meds.Pt says her cervix was not dilated, but she was checked multiple times while there and now has an abscess. No vaginal bleeding. FHR tracing is a category 1, no uc's noted. Dr. Alysia PennaErvin given the pt's medical record number and says he will review her chart.

## 2016-08-16 NOTE — Progress Notes (Signed)
[facility]. [facility] is going to be transferred to [facility]. Dr. Alysia PennaErvin notified.

## 2016-08-16 NOTE — ED Triage Notes (Signed)
Vaginal pain since Friday. ? Abscess to labia and drainaged yellow fluid today. Denies cramping

## 2016-08-16 NOTE — Progress Notes (Signed)
Spoke to Dr. Alysia PennaErvin. FHR tracing is a Scientist, research (medical)category1 reacing with no uc's. Okay to sign off. [facility] RN notified.

## 2016-08-16 NOTE — ED Notes (Signed)
Pt on auto VS and fetal monitor. Pelvic Cart at bedside.

## 2016-08-16 NOTE — ED Notes (Signed)
ED Provider at bedside. 

## 2016-08-16 NOTE — ED Notes (Signed)
Mary, from [facility] , called and states  will be on stand by if needed and will d/c remote fetal monitoring at this time.

## 2016-09-20 ENCOUNTER — Inpatient Hospital Stay (HOSPITAL_COMMUNITY)
Admission: AD | Admit: 2016-09-20 | Discharge: 2016-09-23 | DRG: 775 | Disposition: A | Discharge status: Home / Self Care | Payer: Medicaid Other | Source: Ambulatory Visit | Attending: Obstetrics & Gynecology | Admitting: Obstetrics & Gynecology

## 2016-09-20 ENCOUNTER — Encounter (HOSPITAL_COMMUNITY): Payer: Self-pay

## 2016-09-20 DIAGNOSIS — O99344 Other mental disorders complicating childbirth: Secondary | ICD-10-CM | POA: Diagnosis present

## 2016-09-20 DIAGNOSIS — Z8249 Family history of ischemic heart disease and other diseases of the circulatory system: Secondary | ICD-10-CM | POA: Diagnosis not present

## 2016-09-20 DIAGNOSIS — O99824 Streptococcus B carrier state complicating childbirth: Secondary | ICD-10-CM | POA: Diagnosis present

## 2016-09-20 DIAGNOSIS — O4202 Full-term premature rupture of membranes, onset of labor within 24 hours of rupture: Secondary | ICD-10-CM | POA: Diagnosis present

## 2016-09-20 DIAGNOSIS — Z87891 Personal history of nicotine dependence: Secondary | ICD-10-CM

## 2016-09-20 DIAGNOSIS — Z3A39 39 weeks gestation of pregnancy: Secondary | ICD-10-CM

## 2016-09-20 DIAGNOSIS — F329 Major depressive disorder, single episode, unspecified: Secondary | ICD-10-CM | POA: Diagnosis present

## 2016-09-20 MED ORDER — ONDANSETRON HCL 4 MG/2ML IJ SOLN: 4.0000 mg | Freq: Four times a day (QID) | INTRAMUSCULAR | Status: DC | PRN

## 2016-09-20 MED ORDER — ACETAMINOPHEN 325 MG PO TABS: 650.0000 mg | ORAL_TABLET | ORAL | Status: DC | PRN

## 2016-09-20 MED ORDER — LIDOCAINE HCL (PF) 1 % IJ SOLN
30.0000 mL | INTRAMUSCULAR | Status: DC | PRN
  Filled 2016-09-20: qty 30

## 2016-09-20 MED ORDER — OXYCODONE-ACETAMINOPHEN 5-325 MG PO TABS: 2.0000 | ORAL_TABLET | ORAL | Status: DC | PRN

## 2016-09-20 MED ORDER — OXYTOCIN 40 UNITS IN LACTATED RINGERS INFUSION - SIMPLE MED
2.5000 [IU]/h | INTRAVENOUS | Status: DC
  Filled 2016-09-20: qty 1000

## 2016-09-20 MED ORDER — LACTATED RINGERS IV SOLN: 500.0000 mL | INTRAVENOUS | Status: DC | PRN

## 2016-09-20 MED ORDER — SOD CITRATE-CITRIC ACID 500-334 MG/5ML PO SOLN: 30.0000 mL | ORAL | Status: DC | PRN

## 2016-09-20 MED ORDER — LACTATED RINGERS IV SOLN
INTRAVENOUS | Status: DC
  Administered 2016-09-21 (×2): via INTRAVENOUS

## 2016-09-20 MED ORDER — OXYCODONE-ACETAMINOPHEN 5-325 MG PO TABS: 1.0000 | ORAL_TABLET | ORAL | Status: DC | PRN

## 2016-09-20 MED ORDER — OXYTOCIN BOLUS FROM INFUSION
500.0000 mL | Freq: Once | INTRAVENOUS | Status: AC
  Administered 2016-09-21: 500 mL via INTRAVENOUS
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
## 2016-09-20 NOTE — Progress Notes (Signed)
G3P2 @ 39.[redacted] wksga. Presents to triage for labor and SROM at 2145 clear.   SVE 3-4/80/

## 2016-09-20 NOTE — MAU Note (Signed)
Pt here with c/o rupture of membranes.

## 2016-09-21 ENCOUNTER — Inpatient Hospital Stay (HOSPITAL_COMMUNITY): Payer: Medicaid Other | Admitting: Anesthesiology

## 2016-09-21 ENCOUNTER — Encounter (HOSPITAL_COMMUNITY): Payer: Self-pay

## 2016-09-21 DIAGNOSIS — Z3A39 39 weeks gestation of pregnancy: Secondary | ICD-10-CM

## 2016-09-21 LAB — CBC
HCT: 35.9 % — ABNORMAL LOW (ref 36.0–46.0)
HEMOGLOBIN: 12.1 g/dL (ref 12.0–15.0)
MCH: 30.7 pg (ref 26.0–34.0)
MCHC: 33.7 g/dL (ref 30.0–36.0)
MCV: 91.1 fL (ref 78.0–100.0)
Platelets: 248 10*3/uL (ref 150–400)
RBC: 3.94 MIL/uL (ref 3.87–5.11)
RDW: 13.1 % (ref 11.5–15.5)
WBC: 6.4 10*3/uL (ref 4.0–10.5)

## 2016-09-21 LAB — GROUP B STREP BY PCR: GROUP B STREP BY PCR: POSITIVE — AB

## 2016-09-21 LAB — RAPID URINE DRUG SCREEN, HOSP PERFORMED
AMPHETAMINES: NOT DETECTED
BENZODIAZEPINES: NOT DETECTED
Barbiturates: NOT DETECTED
Cocaine: NOT DETECTED
Opiates: NOT DETECTED
Tetrahydrocannabinol: NOT DETECTED

## 2016-09-21 LAB — RAPID HIV SCREEN (HIV 1/2 AB+AG)
HIV 1/2 Antibodies: NONREACTIVE
HIV-1 P24 ANTIGEN - HIV24: NONREACTIVE

## 2016-09-21 LAB — HEPATITIS B SURFACE ANTIGEN: Hepatitis B Surface Ag: NEGATIVE

## 2016-09-21 LAB — TYPE AND SCREEN
ABO/RH(D): O POS
Antibody Screen: NEGATIVE

## 2016-09-21 LAB — ABO/RH: ABO/RH(D): O POS

## 2016-09-21 MED ORDER — EPHEDRINE 5 MG/ML INJ
10.0000 mg | INTRAVENOUS | Status: DC | PRN
  Filled 2016-09-21: qty 2

## 2016-09-21 MED ORDER — PHENYLEPHRINE 40 MCG/ML (10ML) SYRINGE FOR IV PUSH (FOR BLOOD PRESSURE SUPPORT)
80.0000 ug | PREFILLED_SYRINGE | INTRAVENOUS | Status: DC | PRN
  Filled 2016-09-21: qty 5

## 2016-09-21 MED ORDER — DIPHENHYDRAMINE HCL 50 MG/ML IJ SOLN: 12.5000 mg | INTRAMUSCULAR | Status: DC | PRN

## 2016-09-21 MED ORDER — PHENYLEPHRINE 40 MCG/ML (10ML) SYRINGE FOR IV PUSH (FOR BLOOD PRESSURE SUPPORT)
80.0000 ug | PREFILLED_SYRINGE | INTRAVENOUS | Status: DC | PRN
  Filled 2016-09-21: qty 5
  Filled 2016-09-21: qty 10

## 2016-09-21 MED ORDER — ZOLPIDEM TARTRATE 5 MG PO TABS: 5.0000 mg | ORAL_TABLET | Freq: Every evening | ORAL | Status: DC | PRN

## 2016-09-21 MED ORDER — FENTANYL 2.5 MCG/ML BUPIVACAINE 1/10 % EPIDURAL INFUSION (WH - ANES): 14.0000 mL/h | INTRAMUSCULAR | Status: DC | PRN

## 2016-09-21 MED ORDER — LACTATED RINGERS IV SOLN
500.0000 mL | Freq: Once | INTRAVENOUS | Status: AC
  Administered 2016-09-21: 500 mL via INTRAVENOUS

## 2016-09-21 MED ORDER — IBUPROFEN 600 MG PO TABS
600.0000 mg | ORAL_TABLET | Freq: Four times a day (QID) | ORAL | Status: DC
  Administered 2016-09-21: 600 mg via ORAL
  Filled 2016-09-21 (×3): qty 1

## 2016-09-21 MED ORDER — CYCLOBENZAPRINE HCL 10 MG PO TABS
10.0000 mg | ORAL_TABLET | Freq: Three times a day (TID) | ORAL | Status: DC
  Administered 2016-09-21 – 2016-09-23 (×5): 10 mg via ORAL
  Filled 2016-09-21 (×7): qty 1

## 2016-09-21 MED ORDER — WITCH HAZEL-GLYCERIN EX PADS: 1.0000 "application " | MEDICATED_PAD | CUTANEOUS | Status: DC | PRN

## 2016-09-21 MED ORDER — SENNOSIDES-DOCUSATE SODIUM 8.6-50 MG PO TABS
2.0000 | ORAL_TABLET | ORAL | Status: DC
  Administered 2016-09-22: 2 via ORAL
  Filled 2016-09-21 (×2): qty 2

## 2016-09-21 MED ORDER — SODIUM CHLORIDE 0.9% FLUSH
3.0000 mL | Freq: Two times a day (BID) | INTRAVENOUS | Status: DC
  Administered 2016-09-21 – 2016-09-22 (×3): 3 mL via INTRAVENOUS

## 2016-09-21 MED ORDER — LACTATED RINGERS IV SOLN: 500.0000 mL | Freq: Once | INTRAVENOUS | Status: AC

## 2016-09-21 MED ORDER — SODIUM CHLORIDE 0.9 % IV SOLN: 250.0000 mL | INTRAVENOUS | Status: DC | PRN

## 2016-09-21 MED ORDER — TETANUS-DIPHTH-ACELL PERTUSSIS 5-2.5-18.5 LF-MCG/0.5 IM SUSP: 0.5000 mL | Freq: Once | INTRAMUSCULAR | Status: DC

## 2016-09-21 MED ORDER — PROMETHAZINE HCL 25 MG/ML IJ SOLN
25.0000 mg | Freq: Once | INTRAMUSCULAR | Status: AC
  Administered 2016-09-21: 25 mg via INTRAVENOUS
  Filled 2016-09-21: qty 1

## 2016-09-21 MED ORDER — LIDOCAINE HCL (PF) 1 % IJ SOLN
INTRAMUSCULAR | Status: DC | PRN
  Administered 2016-09-21 (×2): 5 mL via EPIDURAL

## 2016-09-21 MED ORDER — BENZOCAINE-MENTHOL 20-0.5 % EX AERO: 1.0000 "application " | INHALATION_SPRAY | CUTANEOUS | Status: DC | PRN

## 2016-09-21 MED ORDER — KETOROLAC TROMETHAMINE 30 MG/ML IJ SOLN
30.0000 mg | Freq: Once | INTRAMUSCULAR | Status: AC
  Administered 2016-09-21: 30 mg via INTRAVENOUS
  Filled 2016-09-21: qty 1

## 2016-09-21 MED ORDER — PRENATAL MULTIVITAMIN CH
1.0000 | ORAL_TABLET | Freq: Every day | ORAL | Status: DC
  Administered 2016-09-22 – 2016-09-23 (×2): 1 via ORAL
  Filled 2016-09-21 (×2): qty 1

## 2016-09-21 MED ORDER — ACETAMINOPHEN 325 MG PO TABS
650.0000 mg | ORAL_TABLET | ORAL | Status: DC | PRN
  Administered 2016-09-22: 650 mg via ORAL
  Filled 2016-09-21 (×2): qty 2

## 2016-09-21 MED ORDER — SIMETHICONE 80 MG PO CHEW: 80.0000 mg | CHEWABLE_TABLET | ORAL | Status: DC | PRN

## 2016-09-21 MED ORDER — MEASLES, MUMPS & RUBELLA VAC ~~LOC~~ INJ
0.5000 mL | INJECTION | Freq: Once | SUBCUTANEOUS | Status: DC
  Filled 2016-09-21: qty 0.5

## 2016-09-21 MED ORDER — OXYCODONE HCL 5 MG PO TABS
5.0000 mg | ORAL_TABLET | ORAL | Status: AC | PRN
  Administered 2016-09-21: 5 mg via ORAL
  Filled 2016-09-21: qty 1

## 2016-09-21 MED ORDER — DIPHENHYDRAMINE HCL 25 MG PO CAPS: 25.0000 mg | ORAL_CAPSULE | Freq: Four times a day (QID) | ORAL | Status: DC | PRN

## 2016-09-21 MED ORDER — ONDANSETRON HCL 4 MG PO TABS
4.0000 mg | ORAL_TABLET | ORAL | Status: DC | PRN
  Filled 2016-09-21: qty 1

## 2016-09-21 MED ORDER — DIBUCAINE 1 % RE OINT: 1.0000 "application " | TOPICAL_OINTMENT | RECTAL | Status: DC | PRN

## 2016-09-21 MED ORDER — COCONUT OIL OIL: 1.0000 "application " | TOPICAL_OIL | Status: DC | PRN

## 2016-09-21 MED ORDER — SODIUM CHLORIDE 0.9% FLUSH: 3.0000 mL | INTRAVENOUS | Status: DC | PRN

## 2016-09-21 MED ORDER — ONDANSETRON HCL 4 MG/2ML IJ SOLN
4.0000 mg | INTRAMUSCULAR | Status: DC | PRN
  Administered 2016-09-21: 4 mg via INTRAVENOUS
  Filled 2016-09-21: qty 2

## 2016-09-21 MED ORDER — FENTANYL 2.5 MCG/ML BUPIVACAINE 1/10 % EPIDURAL INFUSION (WH - ANES)
14.0000 mL/h | INTRAMUSCULAR | Status: DC | PRN
  Administered 2016-09-21: 14 mL/h via EPIDURAL
  Filled 2016-09-21: qty 100

## 2016-09-21 MED ORDER — KETOROLAC TROMETHAMINE 15 MG/ML IJ SOLN
15.0000 mg | Freq: Three times a day (TID) | INTRAMUSCULAR | Status: DC | PRN
  Administered 2016-09-22 (×3): 15 mg via INTRAVENOUS
  Filled 2016-09-21 (×4): qty 1

## 2016-09-21 NOTE — Anesthesia Preprocedure Evaluation (Signed)
Anesthesia Evaluation  Patient identified by MRN, date of birth, ID band Patient awake    Reviewed: Allergy & Precautions, H&P , NPO status , Patient's Chart, lab work & pertinent test results  History of Anesthesia Complications (+) PONV  Airway Mallampati: II   Neck ROM: full    Dental   Pulmonary former smoker,    breath sounds clear to auscultation       Cardiovascular negative cardio ROS   Rhythm:regular Rate:Normal     Neuro/Psych PSYCHIATRIC DISORDERS Anxiety Depression    GI/Hepatic   Endo/Other    Renal/GU      Musculoskeletal   Abdominal   Peds  Hematology   Anesthesia Other Findings   Reproductive/Obstetrics                             Anesthesia Physical Anesthesia Plan  ASA: II  Anesthesia Plan: Epidural   Post-op Pain Management:    Induction: Intravenous  Airway Management Planned: Natural Airway  Additional Equipment:   Intra-op Plan:   Post-operative Plan:   Informed Consent: I have reviewed the patients History and Physical, chart, labs and discussed the procedure including the risks, benefits and alternatives for the proposed anesthesia with the patient or authorized representative who has indicated his/her understanding and acceptance.     Plan Discussed with: Anesthesiologist  Anesthesia Plan Comments:         Anesthesia Quick Evaluation

## 2016-09-21 NOTE — Lactation Note (Signed)
This note was copied from a baby's chart. Lactation Consultation Note  Patient Name: Lisa Guerrero Today's Date: 09/21/2016 Reason for consult: Initial assessment  Initial visit at 15 hours of age.  Mom reports having to much pain to breast feed her baby so baby has been supplemented with formula and bottle feedings.  LC discussed supply and demand and encouraged mom to latch baby now that she is feeling better.  Mom reports experience with oldest child nursing for 10 months and her youngest now 6 only for 2 weeks with combined feedings and low milk supply.   Mom denies concerns at this time.  [facility] LC resources given and discussed.  Encouraged to feed with early cues on demand.  Early newborn behavior discussed.  Hand expression demonstrated with colostrum visible.  Mom to call for assist as needed.     Maternal Data Has patient been taught Hand Expression?: Yes Does the patient have breastfeeding experience prior to this delivery?: Yes  Feeding    LATCH Score/Interventions                Intervention(s): Breastfeeding basics reviewed     Lactation Tools Discussed/Used WIC Program: Yes   Consult Status Consult Status: Follow-up Date: 09/22/16 Follow-up type: In-patient    Jannifer Rodney 09/21/2016, 6:04 PM

## 2016-09-21 NOTE — Anesthesia Procedure Notes (Signed)
Epidural Patient location during procedure: OB Start time: 09/21/2016 12:25 AM End time: 09/21/2016 12:35 AM  Staffing Anesthesiologist: Chaney Malling, Wataru Mccowen Performed: anesthesiologist   Preanesthetic Checklist Completed: patient identified, site marked, pre-op evaluation, timeout performed, IV checked, risks and benefits discussed and monitors and equipment checked  Epidural Patient position: sitting Prep: DuraPrep Patient monitoring: heart rate, cardiac monitor, continuous pulse ox and blood pressure Approach: midline Location: L2-L3 Injection technique: LOR saline  Needle:  Needle type: Tuohy  Needle gauge: 17 G Needle length: 9 cm Needle insertion depth: 7 cm Catheter type: closed end flexible Catheter size: 19 Gauge Catheter at skin depth: 11 cm Test dose: negative and Other  Assessment Events: blood not aspirated, injection not painful, no injection resistance and negative IV test  Additional Notes Informed consent obtained prior to proceeding including risk of failure, 1% risk of PDPH, risk of minor discomfort and bruising.  Discussed rare but serious complications including epidural abscess, permanent nerve injury, epidural hematoma.  Discussed alternatives to epidural analgesia and patient desires to proceed.  Timeout performed pre-procedure verifying patient name, procedure, and platelet count.  Patient tolerated procedure well. Reason for block:procedure for pain

## 2016-09-21 NOTE — Progress Notes (Signed)
Pt received to [facility] room #143 at 0430. C/o 7/10 stabbing R side/back pain that just started. Per pt, she had the same pain after her last delivery r/t ?pulled muscle, also talking about "blood behind my kidney", "only thing that made it better was I think toradol." Pt able to sit in bed and breastfeed baby. Dr. Alanda Slim notified of pt's pain but that pt has not yet been medicated. Will try ordered postpartum meds first.

## 2016-09-21 NOTE — Progress Notes (Signed)
5 mg oxy IR and 4 mg po zofran given to pt at 0615 for pain and c/o nausea. At 0630, pt vomited and states "I tasted the pain medicine" when she vomited. Pt now requesting pain medication and medication for nausea. Dr. Alanda Slim called. Order for IV phenergan received.

## 2016-09-21 NOTE — Plan of Care (Signed)
Problem: Pain Managment: Goal: General experience of comfort will improve Patient is complaining of pain in her right side near hip. Dr. Sydnee Cabal notified and in to evaluate patient earlier. Flexeril was ordered po. Patient tool med and vomited a small amount of emesis about 45 minutes later. K Pad has been applied to site, on and off today for comfort. Patient is mostly sleepy, keeps her eyes closed and responds to verbal request only without eye contact.  Patient did get up with prompting and assistance and went to the bathroom to void. She tolerated OOB well.   Patient requested Toradol IV for her muscular/ side pain. She was verbal and cognitive and described having a similar experience after a previous delivery. She reports Toradol relieved her pain in the past. Order was obtained from Dr. Sydnee Cabal and med given. Will evaluate outcome for pain management. Patient reports her pain as 7-10 when asked.

## 2016-09-21 NOTE — Anesthesia Postprocedure Evaluation (Signed)
Anesthesia Post Note  Patient: Lisa Guerrero  Procedure(s) Performed: * No procedures listed *  Patient location during evaluation: Mother Baby Anesthesia Type: Epidural Level of consciousness: awake and alert Pain management: pain level controlled Vital Signs Assessment: post-procedure vital signs reviewed and stable Respiratory status: spontaneous breathing, nonlabored ventilation and respiratory function stable Cardiovascular status: stable Postop Assessment: no headache, no backache and epidural receding Anesthetic complications: no        Last Vitals:  Vitals:   09/21/16 0430 09/21/16 0540  BP: 124/80 113/66  Pulse: 79 76  Resp: 18 18  Temp: 36.9 C 36.9 C    Last Pain:  Vitals:   09/21/16 0540  TempSrc: Oral  PainSc: 8    Pain Goal: Patients Stated Pain Goal: 2 (09/21/16 0540)               Junious Silk

## 2016-09-21 NOTE — Plan of Care (Signed)
Problem: Role Relationship: Goal: Ability to demonstrate positive interaction with newborn will improve Patient has had very attentive and supportive family. Family has been providing care to newborn. Mother has not interacted with her baby during this shift.

## 2016-09-21 NOTE — Progress Notes (Signed)
Spoke with Dr. Alanda Slim. Pt c/o 8/10 R side/back squeezing pain. Pt states is unrelieved with motrin, declines tylenol, says "it won't work." Heat pack given. Order received for one time dose of oxy IR.

## 2016-09-21 NOTE — Plan of Care (Signed)
Problem: Nutritional: Goal: Dietary intake will improve Patient has had sips of ginger ale, a cracker and spoonfuls of jello. Patient has complained of nausea and vomited small amount of emesis x 2 following medication consumption. Phenergan and Zofran IV have been administered for symptoms. Encouraged gradual eating as tolerated before taking medications on an empty stomach to avoid vomiting.

## 2016-09-22 LAB — RPR: RPR Ser Ql: NONREACTIVE

## 2016-09-22 LAB — RUBELLA SCREEN: Rubella: 3.67 index (ref >0.99)

## 2016-09-22 NOTE — Progress Notes (Signed)
CSW attempted again to meet with MOB to complete assessment, but she was asleep and did not rouse when CSW knocked and entered the room.  She had two female visitors with her who were caring for the baby.  They were pleasant and stated they are well.  CSW did not awaken MOB and will attempt again to meet with her tomorrow. 

## 2016-09-22 NOTE — Progress Notes (Signed)
POSTPARTUM PROGRESS NOTE  Post Partum Day #1 Subjective:  Lisa Guerrero is a 31 y.o. G4P1003 [redacted]w[redacted]d s/p SVD.  No acute events overnight.  Pt denies problems with ambulating, voiding or po intake.  She denies nausea or vomiting.  Pain is moderately controlled.  She has had flatus. She has not had bowel movement.  Lochia Minimal.   Objective: Blood pressure 105/70, pulse 73, temperature 98.4 F (36.9 C), resp. rate 18, height  (1.651 m), weight 170 lb 9.6 oz (77.4 kg), last menstrual period 12/17/2015, SpO2 98 %, unknown if currently breastfeeding.  Physical Exam:  General: alert, cooperative and no distress Lochia:normal flow Chest: no respiratory distress Heart:regular rate, distal pulses intact Abdomen: soft, nontender,  Uterine Fundus: firm, appropriately tender DVT Evaluation: No calf swelling or tenderness Extremities: minimal edema   Recent Labs  09/20/16 2324  HGB 12.1  HCT 35.9*    Assessment/Plan:  ASSESSMENT: Lisa Guerrero is a 31 y.o. G4P1003 [redacted]w[redacted]d s/p SVD. Patient was GBS + and untreated.  Plan for discharge tomorrow given patient was GBS+ baby, breastfeeding for contraception patient will do nexplanon outpatient.   LOS: 2 days   Abdoulaye DialloMD 09/22/2016, 7:43 AM   Midwife attestation Post Partum Day 1 I have seen and examined this patient and agree with above documentation in the resident's note.   Lisa Guerrero is a 31 y.o. G4P1003 s/p SVD.  Pt denies problems with ambulating, voiding or po intake. Pain is moderately controlled. Continues to have right flank pain that is much improved with heat and Toradol. Method of Feeding: both  PE:  Gen: well appearing Heart: reg rate Lungs: normal WOB Fundus firm Ext: soft, no pain, no edema  Assessment: S/p SVD PPD #1 MSK pain  Plan for discharge: tomorrow  Donette Larry, CNM 7:55 AM

## 2016-09-22 NOTE — Progress Notes (Signed)
CSW attempted to meet with MOB to complete assessment, but she had numerous visitors at this time.  CSW will attempt again at a later time.

## 2016-09-22 NOTE — Lactation Note (Signed)
This note was copied from a baby's chart. Lactation Consultation Note: Experienced BF mom reports baby is not getting enough milk. Reports she can hand express Colostrum. Has been giving bottles of formula because she feels like baby is not getting enough. Encouraged to always breast feed first then give formula is baby is still hungry. Reports she is latching well with no pain. No questions at present. To call prn  Patient Name: Lisa Guerrero [id number] Date: 09/22/2016 Reason for consult: Follow-up assessment   Maternal Data Formula Feeding for Exclusion: Yes Reason for exclusion: Mother's choice to formula and breast feed on admission Has patient been taught Hand Expression?: Yes Does the patient have breastfeeding experience prior to this delivery?: Yes  Feeding    LATCH Score/Interventions                      Lactation Tools Discussed/Used     Consult Status Consult Status: Follow-up Date: 09/23/16 Follow-up type: In-patient    Pamelia Hoit 09/22/2016, 11:13 AM

## 2016-09-23 MED ORDER — IBUPROFEN 600 MG PO TABS: 600.0000 mg | ORAL_TABLET | Freq: Four times a day (QID) | ORAL | 0 refills | Status: DC | PRN

## 2016-09-23 NOTE — Discharge Summary (Signed)
OB Discharge Summary     Patient Name: Lisa Guerrero DOB: 01/31/1986 MRN: [id number]  Date of admission: 09/20/2016 Delivering MD: Candelaria Stagers T   Date of discharge: 09/23/2016  Admitting diagnosis: 39 WKS, WATER BROKE Intrauterine pregnancy: [redacted]w[redacted]d     Secondary diagnosis:  Active Problems:   Normal labor and delivery  Additional problems: care in [facility] w/ Dr. Shawnie Pons, h/o substance abuse, depression. UDS neg on admit. GBS + w/ inadequate tx     Discharge diagnosis: Term Pregnancy Delivered                                                                                                Post partum procedures:n/a  Augmentation: none  Complications: None  Hospital course:  Onset of Labor With Vaginal Delivery     31 y.o. yo G4P1003 at [redacted]w[redacted]d was admitted in Latent Labor on 09/20/2016. Patient had an uncomplicated labor course as follows:  Membrane Rupture Time/Date: 9:45 PM ,09/20/2016   Intrapartum Procedures: Episiotomy: None [1]                                         Lacerations:  None [1]  Patient had a delivery of a Viable infant. 09/21/2016  Information for the patient's newborn:  Skiler, Tye Girl Aracelis [[id number]]  Delivery Method: Vag-Spont    Pateint had an uncomplicated postpartum course.  She is ambulating, tolerating a regular diet, passing flatus, and urinating well. Patient is discharged home in stable condition on 09/23/16.   Physical exam  Vitals:   09/21/16 1835 09/22/16 0645 09/22/16 1830 09/23/16 0500  BP: 109/72 105/70 105/73 116/76  Pulse: 83 73 86 80  Resp: Temp: 98 F (36.7 C) 98.4 F (36.9 C) 97.9 F (36.6 C) 97.8 F (36.6 C)  TempSrc: Oral  Oral Oral  SpO2:      Weight:      Height:       General: alert, cooperative and no distress Lochia: appropriate Uterine Fundus: firm Incision: N/A DVT Evaluation: No evidence of DVT seen on physical exam. Negative Homan's sign. No cords or calf tenderness. No significant calf/ankle  edema. Labs: Lab Results  Component Value Date   WBC 6.4 09/20/2016   HGB 12.1 09/20/2016   HCT 35.9 (L) 09/20/2016   MCV 91.1 09/20/2016   PLT 248 09/20/2016   CMP Latest Ref Rng & Units 04/13/2016  Glucose 65 - 99 mg/dL 77  BUN 6 - 20 mg/dL <6(D)  Creatinine 1.49 - 1.00 mg/dL 7.02  Sodium 637 - 858 mmol/L 133(L)  Potassium 3.5 - 5.1 mmol/L 3.6  Chloride 101 - 111 mmol/L 104  CO2 22 - 32 mmol/L 23  Calcium 8.9 - 10.3 mg/dL 9.0  Total Protein 6.0 - 8.3 g/dL -  Total Bilirubin 0.3 - 1.2 mg/dL -  Alkaline Phos 39 - 850 U/L -  AST 0 - 37 U/L -  ALT 0 - 35 U/L -    Discharge instruction: per After  Visit Summary and "Baby and Me Booklet".  After visit meds:  Allergies as of 09/23/2016      Reactions   Doxycycline Nausea And Vomiting      Medication List    TAKE these medications   CHEWABLES MULTIVITAMIN Chew Chew 1 tablet by mouth daily.   ibuprofen 600 MG tablet Commonly known as:  ADVIL,MOTRIN Take 1 tablet (600 mg total) by mouth every 6 (six) hours as needed for mild pain, moderate pain or cramping.       Diet: routine diet  Activity: Advance as tolerated. Pelvic rest for 6 weeks.   Outpatient follow up:4-6 wks for pp visit w/ Dr. Shawnie Pons per pt preference Follow up Appt:No future appointments. Follow up Visit:No Follow-up on file.  Postpartum contraception: abstinence until Nexplanon  Newborn Data: Live born female  Birth Weight: 7 lb 5.5 oz (3331 g) APGAR: 8, 9  Baby Feeding: br/bottle Disposition:home with mother  SW still has to see pt prior to d/c   09/23/2016 Marge Duncans, CNM

## 2016-09-23 NOTE — Progress Notes (Signed)
Psychosocial assessment completed.  No barriers to discharge identified.  Full documentation to follow. 

## 2016-09-23 NOTE — Discharge Instructions (Signed)
NO SEX UNTIL AFTER YOU GET YOUR BIRTH CONTROL    Postpartum Care After Vaginal Delivery The period of time right after you deliver your newborn is called the postpartum period. What kind of medical care will I receive?  You may continue to receive fluids and medicines through an IV tube inserted into one of your veins.  If an incision was made near your vagina (episiotomy) or if you had some vaginal tearing during delivery, cold compresses may be placed on your episiotomy or your tear. This helps to reduce pain and swelling.  You may be given a squirt bottle to use when you go to the bathroom. You may use this until you are comfortable wiping as usual. To use the squirt bottle, follow these steps:  Before you urinate, fill the squirt bottle with warm water. Do not use hot water.  After you urinate, while you are sitting on the toilet, use the squirt bottle to rinse the area around your urethra and vaginal opening. This rinses away any urine and blood.  You may do this instead of wiping. As you start healing, you may use the squirt bottle before wiping yourself. Make sure to wipe gently.  Fill the squirt bottle with clean water every time you use the bathroom.  You will be given sanitary pads to wear. How can I expect to feel?  You may not feel the need to urinate for several hours after delivery.  You will have some soreness and pain in your abdomen and vagina.  If you are breastfeeding, you may have uterine contractions every time you breastfeed for up to several weeks postpartum. Uterine contractions help your uterus return to its normal size.  It is normal to have vaginal bleeding (lochia) after delivery. The amount and appearance of lochia is often similar to a menstrual period in the first week after delivery. It will gradually decrease over the next few weeks to a dry, yellow-brown discharge. For most women, lochia stops completely by 6-8 weeks after delivery. Vaginal bleeding can  vary from woman to woman.  Within the first few days after delivery, you may have breast engorgement. This is when your breasts feel heavy, full, and uncomfortable. Your breasts may also throb and feel hard, tightly stretched, warm, and tender. After this occurs, you may have milk leaking from your breasts.Your health care provider can help you relieve discomfort due to breast engorgement. Breast engorgement should go away within a few days.  You may feel more sad or worried than normal due to hormonal changes after delivery. These feelings should not last more than a few days. If these feelings do not go away after several days, speak with your health care provider. How should I care for myself?  Tell your health care provider if you have pain or discomfort.  Drink enough water to keep your urine clear or pale yellow.  Wash your hands thoroughly with soap and water for at least 20 seconds after changing your sanitary pads, after using the toilet, and before holding or feeding your baby.  If you are not breastfeeding, avoid touching your breasts a lot. Doing this can make your breasts produce more milk.  If you become weak or lightheaded, or you feel like you might faint, ask for help before:  Getting out of bed.  Showering.  Change your sanitary pads frequently. Watch for any changes in your flow, such as a sudden increase in volume, a change in color, the passing of large blood  clots. If you pass a blood clot from your vagina, save it to show to your health care provider. Do not flush blood clots down the toilet without having your health care provider look at them.  Make sure that all your vaccinations are up to date. This can help protect you and your baby from getting certain diseases. You may need to have immunizations done before you leave the hospital.  If desired, talk with your health care provider about methods of family planning or birth control (contraception). How can I start  bonding with my baby? Spending as much time as possible with your baby is very important. During this time, you and your baby can get to know each other and develop a bond. Having your baby stay with you in your room (rooming in) can give you time to get to know your baby. Rooming in can also help you become comfortable caring for your baby. Breastfeeding can also help you bond with your baby. How can I plan for returning home with my baby?  Make sure that you have a car seat installed in your vehicle.  Your car seat should be checked by a certified car seat installer to make sure that it is installed safely.  Make sure that your baby fits into the car seat safely.  Ask your health care provider any questions you have about caring for yourself or your baby. Make sure that you are able to contact your health care provider with any questions after leaving the hospital. This information is not intended to replace advice given to you by your health care provider. Make sure you discuss any questions you have with your health care provider. Document Released: 03/29/2007 Document Revised: 11/04/2015 Document Reviewed: 05/06/2015 Elsevier Interactive Patient Education  2017 [employer].   Breastfeeding Deciding to breastfeed is one of the best choices you can make for you and your baby. A change in hormones during pregnancy causes your breast tissue to grow and increases the number and size of your milk ducts. These hormones also allow proteins, sugars, and fats from your blood supply to make breast milk in your milk-producing glands. Hormones prevent breast milk from being released before your baby is born as well as prompt milk flow after birth. Once breastfeeding has begun, thoughts of your baby, as well as his or her sucking or crying, can stimulate the release of milk from your milk-producing glands. Benefits of breastfeeding For Your Baby  Your first milk (colostrum) helps your baby's digestive  system function better.  There are antibodies in your milk that help your baby fight off infections.  Your baby has a lower incidence of asthma, allergies, and sudden infant death syndrome.  The nutrients in breast milk are better for your baby than infant formulas and are designed uniquely for your babys needs.  Breast milk improves your baby's brain development.  Your baby is less likely to develop other conditions, such as childhood obesity, asthma, or type 2 diabetes mellitus. For You  Breastfeeding helps to create a very special bond between you and your baby.  Breastfeeding is convenient. Breast milk is always available at the correct temperature and costs nothing.  Breastfeeding helps to burn calories and helps you lose the weight gained during pregnancy.  Breastfeeding makes your uterus contract to its prepregnancy size faster and slows bleeding (lochia) after you give birth.  Breastfeeding helps to lower your risk of developing type 2 diabetes mellitus, osteoporosis, and breast or ovarian cancer later  in life. Signs that your baby is hungry Early Signs of Hunger  Increased alertness or activity.  Stretching.  Movement of the head from side to side.  Movement of the head and opening of the mouth when the corner of the mouth or cheek is stroked (rooting).  Increased sucking sounds, smacking lips, cooing, sighing, or squeaking.  Hand-to-mouth movements.  Increased sucking of fingers or hands. Late Signs of Hunger  Fussing.  Intermittent crying. Extreme Signs of Hunger  Signs of extreme hunger will require calming and consoling before your baby will be able to breastfeed successfully. Do not wait for the following signs of extreme hunger to occur before you initiate breastfeeding:  Restlessness.  A loud, strong cry.  Screaming. Breastfeeding basics  Breastfeeding Initiation  Find a comfortable place to sit or lie down, with your neck and back well  supported.  Place a pillow or rolled up blanket under your baby to bring him or her to the level of your breast (if you are seated). Nursing pillows are specially designed to help support your arms and your baby while you breastfeed.  Make sure that your baby's abdomen is facing your abdomen.  Gently massage your breast. With your fingertips, massage from your chest wall toward your nipple in a circular motion. This encourages milk flow. You may need to continue this action during the feeding if your milk flows slowly.  Support your breast with 4 fingers underneath and your thumb above your nipple. Make sure your fingers are well away from your nipple and your babys mouth.  Stroke your baby's lips gently with your finger or nipple.  When your baby's mouth is open wide enough, quickly bring your baby to your breast, placing your entire nipple and as much of the colored area around your nipple (areola) as possible into your baby's mouth.  More areola should be visible above your baby's upper lip than below the lower lip.  Your baby's tongue should be between his or her lower gum and your breast.  Ensure that your baby's mouth is correctly positioned around your nipple (latched). Your baby's lips should create a seal on your breast and be turned out (everted).  It is common for your baby to suck about 2-3 minutes in order to start the flow of breast milk. Latching  Teaching your baby how to latch on to your breast properly is very important. An improper latch can cause nipple pain and decreased milk supply for you and poor weight gain in your baby. Also, if your baby is not latched onto your nipple properly, he or she may swallow some air during feeding. This can make your baby fussy. Burping your baby when you switch breasts during the feeding can help to get rid of the air. However, teaching your baby to latch on properly is still the best way to prevent fussiness from swallowing air while  breastfeeding. Signs that your baby has successfully latched on to your nipple:  Silent tugging or silent sucking, without causing you pain.  Swallowing heard between every 3-4 sucks.  Muscle movement above and in front of his or her ears while sucking. Signs that your baby has not successfully latched on to nipple:  Sucking sounds or smacking sounds from your baby while breastfeeding.  Nipple pain. If you think your baby has not latched on correctly, slip your finger into the corner of your babys mouth to break the suction and place it between your baby's gums. Attempt breastfeeding initiation  again. Signs of Successful Breastfeeding  Signs from your baby:  A gradual decrease in the number of sucks or complete cessation of sucking.  Falling asleep.  Relaxation of his or her body.  Retention of a small amount of milk in his or her mouth.  Letting go of your breast by himself or herself. Signs from you:  Breasts that have increased in firmness, weight, and size 1-3 hours after feeding.  Breasts that are softer immediately after breastfeeding.  Increased milk volume, as well as a change in milk consistency and color by the fifth day of breastfeeding.  Nipples that are not sore, cracked, or bleeding. Signs That Your Pecola Leisure is Getting Enough Milk  Wetting at least 1-2 diapers during the first 24 hours after birth.  Wetting at least 5-6 diapers every 24 hours for the first week after birth. The urine should be clear or pale yellow by 5 days after birth.  Wetting 6-8 diapers every 24 hours as your baby continues to grow and develop.  At least 3 stools in a 24-hour period by age 76 days. The stool should be soft and yellow.  At least 3 stools in a 24-hour period by age 7 days. The stool should be seedy and yellow.  No loss of weight greater than 10% of birth weight during the first 40 days of age.  Average weight gain of 4-7 ounces (113-198 g) per week after age 34  days.  Consistent daily weight gain by age 76 days, without weight loss after the age of 2 weeks. After a feeding, your baby may spit up a small amount. This is common. Breastfeeding frequency and duration Frequent feeding will help you make more milk and can prevent sore nipples and breast engorgement. Breastfeed when you feel the need to reduce the fullness of your breasts or when your baby shows signs of hunger. This is called "breastfeeding on demand." Avoid introducing a pacifier to your baby while you are working to establish breastfeeding (the first 4-6 weeks after your baby is born). After this time you may choose to use a pacifier. Research has shown that pacifier use during the first year of a baby's life decreases the risk of sudden infant death syndrome (SIDS). Allow your baby to feed on each breast as long as he or she wants. Breastfeed until your baby is finished feeding. When your baby unlatches or falls asleep while feeding from the first breast, offer the second breast. Because newborns are often sleepy in the first few weeks of life, you may need to awaken your baby to get him or her to feed. Breastfeeding times will vary from baby to baby. However, the following rules can serve as a guide to help you ensure that your baby is properly fed:  Newborns (babies 24 weeks of age or younger) may breastfeed every 1-3 hours.  Newborns should not go longer than 3 hours during the day or 5 hours during the night without breastfeeding.  You should breastfeed your baby a minimum of 8 times in a 24-hour period until you begin to introduce solid foods to your baby at around 30 months of age. Breast milk pumping Pumping and storing breast milk allows you to ensure that your baby is exclusively fed your breast milk, even at times when you are unable to breastfeed. This is especially important if you are going back to work while you are still breastfeeding or when you are not able to be present during  feedings. Your  lactation consultant can give you guidelines on how long it is safe to store breast milk. A breast pump is a machine that allows you to pump milk from your breast into a sterile bottle. The pumped breast milk can then be stored in a refrigerator or freezer. Some breast pumps are operated by hand, while others use electricity. Ask your lactation consultant which type will work best for you. Breast pumps can be purchased, but some hospitals and breastfeeding support groups lease breast pumps on a monthly basis. A lactation consultant can teach you how to hand express breast milk, if you prefer not to use a pump. Caring for your breasts while you breastfeed Nipples can become dry, cracked, and sore while breastfeeding. The following recommendations can help keep your breasts moisturized and healthy:  Avoid using soap on your nipples.  Wear a supportive bra. Although not required, special nursing bras and tank tops are designed to allow access to your breasts for breastfeeding without taking off your entire bra or top. Avoid wearing underwire-style bras or extremely tight bras.  Air dry your nipples for 3-33minutes after each feeding.  Use only cotton bra pads to absorb leaked breast milk. Leaking of breast milk between feedings is normal.  Use lanolin on your nipples after breastfeeding. Lanolin helps to maintain your skin's normal moisture barrier. If you use pure lanolin, you do not need to wash it off before feeding your baby again. Pure lanolin is not toxic to your baby. You may also hand express a few drops of breast milk and gently massage that milk into your nipples and allow the milk to air dry. In the first few weeks after giving birth, some women experience extremely full breasts (engorgement). Engorgement can make your breasts feel heavy, warm, and tender to the touch. Engorgement peaks within 3-5 days after you give birth. The following recommendations can help ease  engorgement:  Completely empty your breasts while breastfeeding or pumping. You may want to start by applying warm, moist heat (in the shower or with warm water-soaked hand towels) just before feeding or pumping. This increases circulation and helps the milk flow. If your baby does not completely empty your breasts while breastfeeding, pump any extra milk after he or she is finished.  Wear a snug bra (nursing or regular) or tank top for 1-2 days to signal your body to slightly decrease milk production.  Apply ice packs to your breasts, unless this is too uncomfortable for you.  Make sure that your baby is latched on and positioned properly while breastfeeding. If engorgement persists after 48 hours of following these recommendations, contact your health care provider or a Advertising copywriter. Overall health care recommendations while breastfeeding  Eat healthy foods. Alternate between meals and snacks, eating 3 of each per day. Because what you eat affects your breast milk, some of the foods may make your baby more irritable than usual. Avoid eating these foods if you are sure that they are negatively affecting your baby.  Drink milk, fruit juice, and water to satisfy your thirst (about 10 glasses a day).  Rest often, relax, and continue to take your prenatal vitamins to prevent fatigue, stress, and anemia.  Continue breast self-awareness checks.  Avoid chewing and smoking tobacco. Chemicals from cigarettes that pass into breast milk and exposure to secondhand smoke may harm your baby.  Avoid alcohol and drug use, including marijuana. Some medicines that may be harmful to your baby can pass through breast milk. It is important  to ask your health care provider before taking any medicine, including all over-the-counter and prescription medicine as well as vitamin and herbal supplements. It is possible to become pregnant while breastfeeding. If birth control is desired, ask your health care  provider about options that will be safe for your baby. Contact a health care provider if:  You feel like you want to stop breastfeeding or have become frustrated with breastfeeding.  You have painful breasts or nipples.  Your nipples are cracked or bleeding.  Your breasts are red, tender, or warm.  You have a swollen area on either breast.  You have a fever or chills.  You have nausea or vomiting.  You have drainage other than breast milk from your nipples.  Your breasts do not become full before feedings by the fifth day after you give birth.  You feel sad and depressed.  Your baby is too sleepy to eat well.  Your baby is having trouble sleeping.  Your baby is wetting less than 3 diapers in a 24-hour period.  Your baby has less than 3 stools in a 24-hour period.  Your baby's skin or the white part of his or her eyes becomes yellow.  Your baby is not gaining weight by 77 days of age. Get help right away if:  Your baby is overly tired (lethargic) and does not want to wake up and feed.  Your baby develops an unexplained fever. This information is not intended to replace advice given to you by your health care provider. Make sure you discuss any questions you have with your health care provider. Document Released: 06/01/2005 Document Revised: 11/13/2015 Document Reviewed: 11/23/2012 Elsevier Interactive Patient Education  2017 [employer].

## 2016-09-23 NOTE — Lactation Note (Signed)
This note was copied from a baby's chart. Lactation Consultation Note  Patient Name: Lisa Guerrero Date: 09/23/2016 Reason for consult: Follow-up assessment  Baby 54 hours old. Mom reports that the baby is latching well and she has been putting baby to breast first then supplementing with formula if baby still hungry. Mom states that her milk has started to increase and so she is now nursing more. Mom given a manual pump with review--mom used with first baby. Referred mom to baby and me booklet for EBM storage guidelines and number of diapers to expect by day of life, and discussed engorgement prevention/treatment. Mom aware of OP/BFSG and LC phone line assistance after D/C.   Maternal Data    Feeding    LATCH Score/Interventions                      Lactation Tools Discussed/Used     Consult Status Consult Status: PRN    Sherlyn Hay 09/23/2016, 9:43 AM
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
## 2016-10-02 ENCOUNTER — Encounter (HOSPITAL_BASED_OUTPATIENT_CLINIC_OR_DEPARTMENT_OTHER): Payer: Self-pay | Admitting: *Deleted

## 2016-10-02 DIAGNOSIS — K0889 Other specified disorders of teeth and supporting structures: Secondary | ICD-10-CM | POA: Diagnosis present

## 2016-10-02 DIAGNOSIS — Z87891 Personal history of nicotine dependence: Secondary | ICD-10-CM | POA: Diagnosis not present

## 2016-10-03 ENCOUNTER — Encounter (HOSPITAL_BASED_OUTPATIENT_CLINIC_OR_DEPARTMENT_OTHER): Payer: Self-pay | Admitting: Emergency Medicine

## 2016-10-03 ENCOUNTER — Emergency Department (HOSPITAL_BASED_OUTPATIENT_CLINIC_OR_DEPARTMENT_OTHER)
Admission: EM | Admit: 2016-10-03 | Discharge: 2016-10-03 | Disposition: A | Discharge status: Home / Self Care | Payer: Medicaid Other | Attending: Emergency Medicine | Admitting: Emergency Medicine

## 2016-10-03 DIAGNOSIS — K0889 Other specified disorders of teeth and supporting structures: Secondary | ICD-10-CM

## 2016-10-03 MED ORDER — BUPIVACAINE-EPINEPHRINE (PF) 0.5% -1:200000 IJ SOLN
1.8000 mL | Freq: Once | INTRAMUSCULAR | Status: AC
  Administered 2016-10-03: 1.8 mL

## 2016-10-03 MED ORDER — BUPIVACAINE-EPINEPHRINE (PF) 0.5% -1:200000 IJ SOLN
INTRAMUSCULAR | Status: AC
  Filled 2016-10-03: qty 1.8

## 2016-10-03 MED ORDER — PENICILLIN V POTASSIUM 500 MG PO TABS: 500.0000 mg | ORAL_TABLET | Freq: Four times a day (QID) | ORAL | 0 refills | Status: DC

## 2016-10-03 MED ORDER — HYDROCODONE-ACETAMINOPHEN 5-325 MG PO TABS: 1.0000 | ORAL_TABLET | Freq: Four times a day (QID) | ORAL | 0 refills | Status: DC | PRN
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
## 2016-12-01 ENCOUNTER — Encounter (HOSPITAL_BASED_OUTPATIENT_CLINIC_OR_DEPARTMENT_OTHER): Payer: Self-pay | Admitting: Adult Health

## 2016-12-01 ENCOUNTER — Emergency Department (HOSPITAL_BASED_OUTPATIENT_CLINIC_OR_DEPARTMENT_OTHER): Payer: Medicaid Other

## 2016-12-01 ENCOUNTER — Emergency Department (HOSPITAL_BASED_OUTPATIENT_CLINIC_OR_DEPARTMENT_OTHER)
Admission: EM | Admit: 2016-12-01 | Discharge: 2016-12-01 | Disposition: A | Discharge status: Home / Self Care | Payer: Medicaid Other | Attending: Emergency Medicine | Admitting: Emergency Medicine

## 2016-12-01 DIAGNOSIS — R1011 Right upper quadrant pain: Secondary | ICD-10-CM | POA: Diagnosis not present

## 2016-12-01 DIAGNOSIS — Z87891 Personal history of nicotine dependence: Secondary | ICD-10-CM | POA: Insufficient documentation

## 2016-12-01 DIAGNOSIS — Z79899 Other long term (current) drug therapy: Secondary | ICD-10-CM | POA: Insufficient documentation

## 2016-12-01 DIAGNOSIS — R109 Unspecified abdominal pain: Secondary | ICD-10-CM | POA: Diagnosis present

## 2016-12-01 LAB — URINALYSIS, ROUTINE W REFLEX MICROSCOPIC
BILIRUBIN URINE: NEGATIVE
Glucose, UA: NEGATIVE mg/dL
HGB URINE DIPSTICK: NEGATIVE
Ketones, ur: NEGATIVE mg/dL
Nitrite: NEGATIVE
PH: 7 (ref 5.0–8.0)
Protein, ur: NEGATIVE mg/dL
SPECIFIC GRAVITY, URINE: 1.018 (ref 1.005–1.030)

## 2016-12-01 LAB — URINALYSIS, MICROSCOPIC (REFLEX)

## 2016-12-01 LAB — COMPREHENSIVE METABOLIC PANEL
ALBUMIN: 4 g/dL (ref 3.5–5.0)
ALK PHOS: 71 U/L (ref 38–126)
ALT: 13 U/L — AB (ref 14–54)
ANION GAP: 7 (ref 5–15)
AST: 19 U/L (ref 15–41)
BUN: 7 mg/dL (ref 6–20)
CALCIUM: 8.9 mg/dL (ref 8.9–10.3)
CO2: 25 mmol/L (ref 22–32)
Chloride: 105 mmol/L (ref 101–111)
Creatinine, Ser: 0.75 mg/dL (ref 0.44–1.00)
GFR calc Af Amer: >60 mL/min (ref >60)
GFR calc non Af Amer: >60 mL/min (ref >60)
GLUCOSE: 101 mg/dL — AB (ref 65–99)
Potassium: 3.8 mmol/L (ref 3.5–5.1)
SODIUM: 137 mmol/L (ref 135–145)
Total Bilirubin: 0.4 mg/dL (ref 0.3–1.2)
Total Protein: 7.1 g/dL (ref 6.5–8.1)

## 2016-12-01 LAB — CBC
HCT: 39.4 % (ref 36.0–46.0)
HEMOGLOBIN: 13.2 g/dL (ref 12.0–15.0)
MCH: 30.6 pg (ref 26.0–34.0)
MCHC: 33.5 g/dL (ref 30.0–36.0)
MCV: 91.2 fL (ref 78.0–100.0)
Platelets: 267 10*3/uL (ref 150–400)
RBC: 4.32 MIL/uL (ref 3.87–5.11)
RDW: 13.9 % (ref 11.5–15.5)
WBC: 4.2 10*3/uL (ref 4.0–10.5)

## 2016-12-01 LAB — PREGNANCY, URINE: PREG TEST UR: NEGATIVE

## 2016-12-01 LAB — LIPASE, BLOOD: Lipase: 22 U/L (ref 11–51)

## 2016-12-01 MED ORDER — PANTOPRAZOLE SODIUM 20 MG PO TBEC: 20.0000 mg | DELAYED_RELEASE_TABLET | Freq: Every day | ORAL | 1 refills | Status: DC

## 2016-12-01 MED ORDER — SODIUM CHLORIDE 0.9 % IV BOLUS (SEPSIS)
1000.0000 mL | Freq: Once | INTRAVENOUS | Status: AC
  Administered 2016-12-01: 1000 mL via INTRAVENOUS

## 2016-12-01 MED ORDER — MORPHINE SULFATE (PF) 4 MG/ML IV SOLN
4.0000 mg | Freq: Once | INTRAVENOUS | Status: AC
  Administered 2016-12-01: 4 mg via INTRAVENOUS
  Filled 2016-12-01: qty 1

## 2016-12-01 MED ORDER — ONDANSETRON HCL 4 MG/2ML IJ SOLN
4.0000 mg | Freq: Once | INTRAMUSCULAR | Status: AC
  Administered 2016-12-01: 4 mg via INTRAVENOUS
  Filled 2016-12-01: qty 2
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
## 2016-12-01 NOTE — Discharge Instructions (Signed)
You have been seen today for upper abdominal pain. There were no acute or significant abnormalities noted on the ultrasound. Lab work was reassuring. Follow up with your primary care provider soon as possible. Initiate the Protonix daily. Take this medication everyday 20-30 minutes prior to your first meal. Takes this medication regardless of how you feel.

## 2016-12-01 NOTE — ED Triage Notes (Signed)
Preents with right upper quadrant pain began 1.5 weeks ago and the pain radiates to the back described as throbbing and is intermittent, the intensity changes from mild to severe. Eating makes pain worse, pain is relieved by lying on stomach. Denies vomiting, endorses nausea. Pt is 2 months post partum. Not breastfeeding.

## 2017-01-09 ENCOUNTER — Emergency Department (HOSPITAL_BASED_OUTPATIENT_CLINIC_OR_DEPARTMENT_OTHER)
Admission: EM | Admit: 2017-01-09 | Discharge: 2017-01-09 | Disposition: A | Discharge status: Home / Self Care | Payer: Medicaid Other | Attending: Emergency Medicine | Admitting: Emergency Medicine

## 2017-01-09 ENCOUNTER — Encounter (HOSPITAL_BASED_OUTPATIENT_CLINIC_OR_DEPARTMENT_OTHER): Payer: Self-pay

## 2017-01-09 DIAGNOSIS — J029 Acute pharyngitis, unspecified: Secondary | ICD-10-CM

## 2017-01-09 DIAGNOSIS — J00 Acute nasopharyngitis [common cold]: Secondary | ICD-10-CM | POA: Diagnosis not present

## 2017-01-09 DIAGNOSIS — Z87891 Personal history of nicotine dependence: Secondary | ICD-10-CM | POA: Diagnosis not present

## 2017-01-09 DIAGNOSIS — H9203 Otalgia, bilateral: Secondary | ICD-10-CM | POA: Diagnosis not present

## 2017-01-09 DIAGNOSIS — H65193 Other acute nonsuppurative otitis media, bilateral: Secondary | ICD-10-CM

## 2017-01-09 LAB — RAPID STREP SCREEN (MED CTR MEBANE ONLY): Streptococcus, Group A Screen (Direct): NEGATIVE

## 2017-01-09 MED ORDER — LIDOCAINE VISCOUS 2 % MT SOLN
15.0000 mL | Freq: Once | OROMUCOSAL | Status: AC
  Administered 2017-01-09: 15 mL via OROMUCOSAL
  Filled 2017-01-09: qty 15

## 2017-01-09 NOTE — ED Notes (Signed)
ED Provider at bedside. 

## 2017-01-09 NOTE — ED Triage Notes (Signed)
PT reports sore throat, bilateral ear pain, sudden onset this morning. Hoarse voice.
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
## 2017-01-11 LAB — CULTURE, GROUP A STREP (THRC)

## 2017-01-12 ENCOUNTER — Telehealth: Payer: Self-pay | Admitting: Emergency Medicine

## 2017-01-12 NOTE — Telephone Encounter (Signed)
Post ED Visit - Positive Culture Follow-up: Successful Patient Follow-Up  Culture assessed and recommendations reviewed by: []  Enzo BiNathan Batchelder, Pharm.D. []  Celedonio MiyamotoJeremy Frens, Pharm.D., BCPS AQ-ID []  Garvin FilaMike Maccia, Pharm.D., BCPS []  Georgina PillionElizabeth Martin, Pharm.D., BCPS []  MarlinMinh Pham, [address] BoulevardPharm.D., BCPS, AAHIVP []  Estella HuskMichelle Turner, Pharm.D., BCPS, AAHIVP []  Lysle Pearlachel Rumbarger, PharmD, BCPS []  Casilda Carlsaylor Stone, PharmD, BCPS []  Pollyann SamplesAndy Johnston, PharmD, BCPS Sharin MonsEmily Sinclair PharmD  Positive strep culture  [x]  Patient discharged without antimicrobial prescription and treatment is now indicated []  Organism is resistant to prescribed ED discharge antimicrobial []  Patient with positive blood cultures  Changes discussed with ED provider: Buel ReamAlexandra Law PA New antibiotic prescription start Amoxicillin 500 mg po bid x 10 days  Attempting to contact patient   Berle MullMiller, Kyleigha Markert 01/12/2017, 11:43 AM

## 2017-01-12 NOTE — Progress Notes (Signed)
ED Antimicrobial Stewardship Positive Culture Follow Up   Lisa Guerrero is an 31 y.o. female who [facility] with a chief complaint of  Chief Complaint  Patient presents with  . Sore Throat    Recent Results (from the past 720 hour(s))  Rapid strep screen     Status: None   Collection Time: 01/09/17 11:54 AM  Result Value Ref Range Status   Streptococcus, Group A Screen (Direct) NEGATIVE NEGATIVE Final    Comment: (NOTE) A Rapid Antigen test may result negative if the antigen level in the sample is below the detection level of this test. The FDA has not cleared this test as a stand-alone test therefore the rapid antigen negative result has reflexed to a Group A Strep culture.   Culture, group A strep     Status: None   Collection Time: 01/09/17 11:54 AM  Result Value Ref Range Status   Specimen Description THROAT  Final   Special Requests NONE Reflexed from [id number]  Final   Culture ABUNDANT GROUP A STREP (S.PYOGENES) ISOLATED  Final   Report Status 01/11/2017 FINAL  Final     [x]  Patient discharged originally without antimicrobial agent and treatment is now indicated  New antibiotic prescription: Amoxicillin 500 mg BID X 10 days ED Provider: Buel ReamAlexandra Law, PA-C   Della GooEmily S Shaneice Barsanti, PharmD 01/12/2017, 9:51 AM Infectious Diseases Pharmacy Resident Phone# [phone]

## 2017-03-18 ENCOUNTER — Encounter (HOSPITAL_BASED_OUTPATIENT_CLINIC_OR_DEPARTMENT_OTHER): Payer: Self-pay | Admitting: Emergency Medicine

## 2017-03-18 ENCOUNTER — Emergency Department (HOSPITAL_BASED_OUTPATIENT_CLINIC_OR_DEPARTMENT_OTHER)
Admission: EM | Admit: 2017-03-18 | Discharge: 2017-03-18 | Disposition: A | Discharge status: Home / Self Care | Payer: Medicaid Other | Attending: Physician Assistant | Admitting: Physician Assistant

## 2017-03-18 DIAGNOSIS — Z79899 Other long term (current) drug therapy: Secondary | ICD-10-CM | POA: Diagnosis not present

## 2017-03-18 DIAGNOSIS — J020 Streptococcal pharyngitis: Secondary | ICD-10-CM | POA: Insufficient documentation

## 2017-03-18 DIAGNOSIS — Z87891 Personal history of nicotine dependence: Secondary | ICD-10-CM | POA: Insufficient documentation

## 2017-03-18 DIAGNOSIS — R51 Headache: Secondary | ICD-10-CM | POA: Diagnosis present

## 2017-03-18 DIAGNOSIS — R070 Pain in throat: Secondary | ICD-10-CM | POA: Diagnosis not present

## 2017-03-18 DIAGNOSIS — M791 Myalgia, unspecified site: Secondary | ICD-10-CM | POA: Insufficient documentation

## 2017-03-18 LAB — RAPID STREP SCREEN (MED CTR MEBANE ONLY): STREPTOCOCCUS, GROUP A SCREEN (DIRECT): POSITIVE — AB

## 2017-03-18 MED ORDER — DEXAMETHASONE 6 MG PO TABS
10.0000 mg | ORAL_TABLET | Freq: Once | ORAL | Status: AC
  Administered 2017-03-18: 23:00:00 10 mg via ORAL
  Filled 2017-03-18: qty 1

## 2017-03-18 MED ORDER — LIDOCAINE VISCOUS 2 % MT SOLN: 15.0000 mL | OROMUCOSAL | 0 refills | Status: DC | PRN

## 2017-03-18 MED ORDER — PENICILLIN G BENZATHINE 1200000 UNIT/2ML IM SUSP
1.2000 10*6.[IU] | Freq: Once | INTRAMUSCULAR | Status: AC
  Administered 2017-03-18: 1.2 10*6.[IU] via INTRAMUSCULAR
  Filled 2017-03-18: qty 2

## 2017-03-18 MED ORDER — IBUPROFEN 400 MG PO TABS
600.0000 mg | ORAL_TABLET | Freq: Once | ORAL | Status: AC
  Administered 2017-03-18: 600 mg via ORAL
  Filled 2017-03-18: qty 1

## 2017-03-18 MED ORDER — IBUPROFEN 600 MG PO TABS: 600.0000 mg | ORAL_TABLET | Freq: Four times a day (QID) | ORAL | 0 refills | Status: DC | PRN

## 2017-03-18 NOTE — ED Triage Notes (Signed)
Patient states that she started to have a Headache and toothache last night. Patient reports that today she is now having lower back pain and sore throat. Patient reports that her tooth hurts when she drinks any water

## 2017-03-18 NOTE — ED Notes (Signed)
ED Provider at bedside. 
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
## 2017-03-18 NOTE — Discharge Instructions (Signed)
Please read and follow all provided instructions.  Your diagnoses today include: Strep Throat  Tests performed today include: Vital signs. See below for your results today.  Strep Test: This was positive  Medications prescribed:  You were given antibiotics, pain meds and steroids in the department.   Home care instructions:  This is a bacterial infection. Continue to stay well-hydrated. Gargle warm salt water and spit it out. Continued to alternate between Tylenol and ibuprofen for pain. May consider over-the-counter Benadryl for additional relief (decrease secretions). Also discard your toothbrush and begin using a new one in 3 days. Follow-up with your primary care doctor in this week for recheck of ongoing symptoms.   Follow-up instructions: Please follow-up with your primary care provider in 2-3 days for follow up.    Return instructions:  Return to the ED sooner for worsening condition, inability to swallow, breathing difficulty, new concerns.  Additional Information:  Your vital signs today were: BP (!) 96/56 (BP Location: Right Arm)    Pulse 83    Temp 98.4 F (36.9 C)    Resp 18    Ht  (1.651 m)    Wt 68.9 kg (152 lb)    LMP 02/20/2017    SpO2 99%    BMI 25.29 kg/m  If your blood pressure (BP) was elevated above 135/85 this visit, please have this repeated by your doctor within one month. ---------------

## 2017-05-21 ENCOUNTER — Emergency Department (HOSPITAL_BASED_OUTPATIENT_CLINIC_OR_DEPARTMENT_OTHER)
Admission: EM | Admit: 2017-05-21 | Discharge: 2017-05-21 | Disposition: A | Discharge status: Home / Self Care | Payer: Medicaid Other | Attending: Emergency Medicine | Admitting: Emergency Medicine

## 2017-05-21 ENCOUNTER — Other Ambulatory Visit: Payer: Self-pay

## 2017-05-21 ENCOUNTER — Encounter (HOSPITAL_BASED_OUTPATIENT_CLINIC_OR_DEPARTMENT_OTHER): Payer: Self-pay

## 2017-05-21 DIAGNOSIS — Z87891 Personal history of nicotine dependence: Secondary | ICD-10-CM | POA: Diagnosis not present

## 2017-05-21 DIAGNOSIS — K0889 Other specified disorders of teeth and supporting structures: Secondary | ICD-10-CM | POA: Insufficient documentation

## 2017-05-21 DIAGNOSIS — J069 Acute upper respiratory infection, unspecified: Secondary | ICD-10-CM | POA: Diagnosis not present

## 2017-05-21 DIAGNOSIS — R490 Dysphonia: Secondary | ICD-10-CM | POA: Diagnosis present

## 2017-05-21 MED ORDER — AMOXICILLIN 500 MG PO CAPS: 500.0000 mg | ORAL_CAPSULE | Freq: Three times a day (TID) | ORAL | 0 refills | Status: DC
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
## 2017-05-21 NOTE — ED Triage Notes (Signed)
C/o "losing my voice" x 2 days-left lower toothache x 2 days-also requesting preg test-NAD-steady gait

## 2017-05-21 NOTE — Discharge Instructions (Signed)
It was my pleasure taking care of you today!   Please take all of your antibiotics until finished!  Keep your appointment with the dentist.   Return to ER for new or worsening symptoms, any additional concerns.

## 2017-05-28 ENCOUNTER — Other Ambulatory Visit: Payer: Self-pay

## 2017-05-28 ENCOUNTER — Emergency Department (HOSPITAL_BASED_OUTPATIENT_CLINIC_OR_DEPARTMENT_OTHER)
Admission: EM | Admit: 2017-05-28 | Discharge: 2017-05-28 | Disposition: A | Discharge status: Home / Self Care | Payer: Medicaid Other | Attending: Emergency Medicine | Admitting: Emergency Medicine

## 2017-05-28 ENCOUNTER — Encounter (HOSPITAL_BASED_OUTPATIENT_CLINIC_OR_DEPARTMENT_OTHER): Payer: Self-pay

## 2017-05-28 ENCOUNTER — Emergency Department (HOSPITAL_BASED_OUTPATIENT_CLINIC_OR_DEPARTMENT_OTHER): Payer: Medicaid Other

## 2017-05-28 DIAGNOSIS — O4692 Antepartum hemorrhage, unspecified, second trimester: Secondary | ICD-10-CM | POA: Diagnosis present

## 2017-05-28 DIAGNOSIS — Z87891 Personal history of nicotine dependence: Secondary | ICD-10-CM | POA: Diagnosis not present

## 2017-05-28 DIAGNOSIS — Z79899 Other long term (current) drug therapy: Secondary | ICD-10-CM | POA: Diagnosis not present

## 2017-05-28 DIAGNOSIS — Z3A17 17 weeks gestation of pregnancy: Secondary | ICD-10-CM | POA: Diagnosis not present

## 2017-05-28 NOTE — Discharge Instructions (Signed)
He did not allow me to do a pelvic exam.  Therefore, I am not certain as the cause of bleeding.  However, ultrasound did not show any significant pathology.  Please follow-up with your obstetrician.  Return if symptoms are getting worse.

## 2017-05-28 NOTE — ED Notes (Signed)
PT refused pelvic, blood work, and urine sample. Dr Preston FleetingGlick notified.
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
## 2017-05-28 NOTE — ED Triage Notes (Signed)
Pt states she is 17 weeks gravid and started having vaginal bleeding about 15 minutes ago. Pt denies any pain

## 2017-07-01 ENCOUNTER — Encounter (HOSPITAL_BASED_OUTPATIENT_CLINIC_OR_DEPARTMENT_OTHER): Payer: Self-pay

## 2017-07-01 ENCOUNTER — Other Ambulatory Visit: Payer: Self-pay

## 2017-07-01 ENCOUNTER — Emergency Department (HOSPITAL_BASED_OUTPATIENT_CLINIC_OR_DEPARTMENT_OTHER)
Admission: EM | Admit: 2017-07-01 | Discharge: 2017-07-01 | Discharge status: Against Medical Advice | Payer: Medicaid Other | Attending: Emergency Medicine | Admitting: Emergency Medicine

## 2017-07-01 DIAGNOSIS — L03112 Cellulitis of left axilla: Secondary | ICD-10-CM | POA: Diagnosis not present

## 2017-07-01 DIAGNOSIS — Z5329 Procedure and treatment not carried out because of patient's decision for other reasons: Secondary | ICD-10-CM | POA: Diagnosis not present

## 2017-07-01 DIAGNOSIS — L02412 Cutaneous abscess of left axilla: Secondary | ICD-10-CM | POA: Diagnosis present
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
## 2017-07-01 NOTE — ED Triage Notes (Signed)
C/o left axilla abscess x 2 days-hx of same-NAD-steady gait

## 2017-07-01 NOTE — ED Notes (Signed)
I&D tray at bedside.

## 2017-07-06 ENCOUNTER — Other Ambulatory Visit: Payer: Self-pay

## 2017-07-06 ENCOUNTER — Emergency Department (HOSPITAL_BASED_OUTPATIENT_CLINIC_OR_DEPARTMENT_OTHER)
Admission: EM | Admit: 2017-07-06 | Discharge: 2017-07-06 | Disposition: A | Discharge status: Home / Self Care | Payer: Medicaid Other | Attending: Emergency Medicine | Admitting: Emergency Medicine

## 2017-07-06 ENCOUNTER — Encounter (HOSPITAL_BASED_OUTPATIENT_CLINIC_OR_DEPARTMENT_OTHER): Payer: Self-pay | Admitting: Emergency Medicine

## 2017-07-06 DIAGNOSIS — F419 Anxiety disorder, unspecified: Secondary | ICD-10-CM | POA: Diagnosis not present

## 2017-07-06 DIAGNOSIS — L02412 Cutaneous abscess of left axilla: Secondary | ICD-10-CM | POA: Diagnosis not present

## 2017-07-06 DIAGNOSIS — R2232 Localized swelling, mass and lump, left upper limb: Secondary | ICD-10-CM | POA: Diagnosis present

## 2017-07-06 DIAGNOSIS — F329 Major depressive disorder, single episode, unspecified: Secondary | ICD-10-CM | POA: Insufficient documentation

## 2017-07-06 MED ORDER — CEPHALEXIN 500 MG PO CAPS: ORAL_CAPSULE | ORAL | 0 refills | Status: DC

## 2017-07-06 MED ORDER — HYDROCODONE-ACETAMINOPHEN 5-325 MG PO TABS: 1.0000 | ORAL_TABLET | Freq: Four times a day (QID) | ORAL | 0 refills | Status: DC | PRN

## 2017-07-06 MED ORDER — LIDOCAINE-EPINEPHRINE (PF) 2 %-1:200000 IJ SOLN
10.0000 mL | Freq: Once | INTRAMUSCULAR | Status: AC
  Administered 2017-07-06: 10 mL
  Filled 2017-07-06: qty 10

## 2017-07-06 MED FILL — HYDROCODON-APAP 5-325: 5-325 | 2 days supply | Qty: 10 | Fill #0

## 2017-07-06 MED FILL — CEPHALEXIN 500 MG CAPSULE: 500 | 7 days supply | Qty: 28 | Fill #0

## 2017-07-06 NOTE — Discharge Instructions (Signed)
Contact a health care provider if: °You have more redness, swelling, or pain around your abscess. °You have more fluid or blood coming from your abscess. °Your abscess feels warm to the touch. °You have more pus or a bad smell coming from your abscess. °You have a fever. °You have muscle aches. °You have chills or a general ill feeling. °Get help right away if: °You have severe pain. °You see red streaks on your skin spreading away from the abscess. °
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
## 2017-07-06 NOTE — ED Triage Notes (Signed)
Abscess under L axilla.

## 2018-01-19 ENCOUNTER — Other Ambulatory Visit: Payer: Self-pay

## 2018-01-19 ENCOUNTER — Emergency Department (HOSPITAL_BASED_OUTPATIENT_CLINIC_OR_DEPARTMENT_OTHER)
Admission: EM | Admit: 2018-01-19 | Discharge: 2018-01-20 | Disposition: A | Discharge status: Home / Self Care | Payer: Medicaid Other | Attending: Emergency Medicine | Admitting: Emergency Medicine

## 2018-01-19 ENCOUNTER — Emergency Department (HOSPITAL_BASED_OUTPATIENT_CLINIC_OR_DEPARTMENT_OTHER): Payer: Medicaid Other

## 2018-01-19 ENCOUNTER — Encounter (HOSPITAL_BASED_OUTPATIENT_CLINIC_OR_DEPARTMENT_OTHER): Payer: Self-pay | Admitting: *Deleted

## 2018-01-19 DIAGNOSIS — R072 Precordial pain: Secondary | ICD-10-CM | POA: Diagnosis not present

## 2018-01-19 DIAGNOSIS — Z3202 Encounter for pregnancy test, result negative: Secondary | ICD-10-CM | POA: Diagnosis not present

## 2018-01-19 LAB — BASIC METABOLIC PANEL
Anion gap: 8 (ref 5–15)
BUN: 7 mg/dL (ref 6–20)
CHLORIDE: 104 mmol/L (ref 98–111)
CO2: 25 mmol/L (ref 22–32)
Calcium: 8.9 mg/dL (ref 8.9–10.3)
Creatinine, Ser: 0.68 mg/dL (ref 0.44–1.00)
GFR calc Af Amer: >60 mL/min (ref >60)
Glucose, Bld: 91 mg/dL (ref 70–99)
POTASSIUM: 3.5 mmol/L (ref 3.5–5.1)
SODIUM: 137 mmol/L (ref 135–145)

## 2018-01-19 LAB — CBC WITH DIFFERENTIAL/PLATELET
BASOS PCT: 0 %
Basophils Absolute: 0 10*3/uL (ref 0.0–0.1)
EOS ABS: 0 10*3/uL (ref 0.0–0.7)
EOS PCT: 1 %
HCT: 37.9 % (ref 36.0–46.0)
Hemoglobin: 12.5 g/dL (ref 12.0–15.0)
LYMPHS ABS: 2 10*3/uL (ref 0.7–4.0)
Lymphocytes Relative: 41 %
MCH: 30.9 pg (ref 26.0–34.0)
MCHC: 33 g/dL (ref 30.0–36.0)
MCV: 93.6 fL (ref 78.0–100.0)
MONOS PCT: 8 %
Monocytes Absolute: 0.4 10*3/uL (ref 0.1–1.0)
Neutro Abs: 2.4 10*3/uL (ref 1.7–7.7)
Neutrophils Relative %: 50 %
PLATELETS: 220 10*3/uL (ref 150–400)
RBC: 4.05 MIL/uL (ref 3.87–5.11)
RDW: 12.7 % (ref 11.5–15.5)
WBC: 4.9 10*3/uL (ref 4.0–10.5)

## 2018-01-19 LAB — D-DIMER, QUANTITATIVE (NOT AT ARMC)

## 2018-01-19 LAB — TROPONIN I

## 2018-01-19 MED ORDER — IBUPROFEN 800 MG PO TABS: 800.0000 mg | ORAL_TABLET | Freq: Three times a day (TID) | ORAL | 0 refills | Status: DC | PRN

## 2018-01-19 NOTE — ED Notes (Signed)
Patient transported to X-ray 

## 2018-01-19 NOTE — ED Triage Notes (Signed)
Pt   C/o Mid sternal chest pain x 1 day which radiates down left arm and back pain

## 2018-01-19 NOTE — Discharge Instructions (Signed)
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
## 2018-01-20 LAB — PREGNANCY, URINE: Preg Test, Ur: NEGATIVE

## 2018-01-20 LAB — HCG, QUANTITATIVE, PREGNANCY: hCG, Beta Chain, Quant, S: <1 m[IU]/mL (ref <5)
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
## 2018-04-24 ENCOUNTER — Inpatient Hospital Stay (HOSPITAL_BASED_OUTPATIENT_CLINIC_OR_DEPARTMENT_OTHER)
Admission: EM | Admit: 2018-04-24 | Discharge: 2018-04-25 | Disposition: A | Discharge status: Home / Self Care | Payer: Medicaid Other | Attending: Obstetrics & Gynecology | Admitting: Obstetrics & Gynecology

## 2018-04-24 ENCOUNTER — Encounter (HOSPITAL_BASED_OUTPATIENT_CLINIC_OR_DEPARTMENT_OTHER): Payer: Self-pay | Admitting: *Deleted

## 2018-04-24 ENCOUNTER — Other Ambulatory Visit: Payer: Self-pay

## 2018-04-24 DIAGNOSIS — O468X1 Other antepartum hemorrhage, first trimester: Secondary | ICD-10-CM

## 2018-04-24 DIAGNOSIS — R109 Unspecified abdominal pain: Secondary | ICD-10-CM | POA: Insufficient documentation

## 2018-04-24 DIAGNOSIS — O26891 Other specified pregnancy related conditions, first trimester: Secondary | ICD-10-CM

## 2018-04-24 DIAGNOSIS — O418X10 Other specified disorders of amniotic fluid and membranes, first trimester, not applicable or unspecified: Secondary | ICD-10-CM

## 2018-04-24 DIAGNOSIS — O208 Other hemorrhage in early pregnancy: Secondary | ICD-10-CM | POA: Diagnosis not present

## 2018-04-24 DIAGNOSIS — Z3A01 Less than 8 weeks gestation of pregnancy: Secondary | ICD-10-CM | POA: Insufficient documentation

## 2018-04-24 DIAGNOSIS — O200 Threatened abortion: Secondary | ICD-10-CM

## 2018-04-24 DIAGNOSIS — O209 Hemorrhage in early pregnancy, unspecified: Secondary | ICD-10-CM

## 2018-04-24 LAB — CBC WITH DIFFERENTIAL/PLATELET
Abs Immature Granulocytes: 0.02 10*3/uL (ref 0.00–0.07)
BASOS PCT: 1 %
Basophils Absolute: 0 10*3/uL (ref 0.0–0.1)
EOS ABS: 0.1 10*3/uL (ref 0.0–0.5)
EOS PCT: 1 %
HCT: 39.6 % (ref 36.0–46.0)
Hemoglobin: 12.8 g/dL (ref 12.0–15.0)
Immature Granulocytes: 0 %
Lymphocytes Relative: 35 %
Lymphs Abs: 2.2 10*3/uL (ref 0.7–4.0)
MCH: 30.4 pg (ref 26.0–34.0)
MCHC: 32.3 g/dL (ref 30.0–36.0)
MCV: 94.1 fL (ref 80.0–100.0)
Monocytes Absolute: 0.4 10*3/uL (ref 0.1–1.0)
Monocytes Relative: 7 %
NEUTROS ABS: 3.6 10*3/uL (ref 1.7–7.7)
Neutrophils Relative %: 56 %
PLATELETS: 252 10*3/uL (ref 150–400)
RBC: 4.21 MIL/uL (ref 3.87–5.11)
RDW: 12.6 % (ref 11.5–15.5)
WBC: 6.4 10*3/uL (ref 4.0–10.5)
nRBC: 0 % (ref 0.0–0.2)

## 2018-04-24 LAB — COMPREHENSIVE METABOLIC PANEL
ALT: 9 U/L (ref 0–44)
ANION GAP: 7 (ref 5–15)
AST: 13 U/L — ABNORMAL LOW (ref 15–41)
Albumin: 4.3 g/dL (ref 3.5–5.0)
Alkaline Phosphatase: 62 U/L (ref 38–126)
BUN: 10 mg/dL (ref 6–20)
CHLORIDE: 103 mmol/L (ref 98–111)
CO2: 25 mmol/L (ref 22–32)
Calcium: 9.2 mg/dL (ref 8.9–10.3)
Creatinine, Ser: 0.84 mg/dL (ref 0.44–1.00)
GFR calc non Af Amer: >60 mL/min (ref >60)
Glucose, Bld: 89 mg/dL (ref 70–99)
Potassium: 3.9 mmol/L (ref 3.5–5.1)
SODIUM: 135 mmol/L (ref 135–145)
Total Bilirubin: 0.4 mg/dL (ref 0.3–1.2)
Total Protein: 7.3 g/dL (ref 6.5–8.1)

## 2018-04-24 LAB — HCG, QUANTITATIVE, PREGNANCY: HCG, BETA CHAIN, QUANT, S: 3291 m[IU]/mL — AB (ref <5)

## 2018-04-24 LAB — WET PREP, GENITAL
SPERM: NONE SEEN
TRICH WET PREP: NONE SEEN
WBC WET PREP: NONE SEEN
Yeast Wet Prep HPF POC: NONE SEEN

## 2018-04-24 MED ORDER — SODIUM CHLORIDE 0.9 % IV BOLUS
1000.0000 mL | Freq: Once | INTRAVENOUS | Status: AC
  Administered 2018-04-24: 1000 mL via INTRAVENOUS

## 2018-04-24 MED ORDER — ACETAMINOPHEN 325 MG PO TABS
650.0000 mg | ORAL_TABLET | Freq: Once | ORAL | Status: AC
  Administered 2018-04-24: 650 mg via ORAL
  Filled 2018-04-24: qty 2

## 2018-04-24 NOTE — ED Notes (Signed)
[facility] notified (Tammy) - [facility]
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
## 2018-04-24 NOTE — ED Notes (Signed)
Pt states that she began having cramps that" feel like spasms and contractions" in her lower abdomen at approximately 2015. She states her LMP was 03/23/2018. Pt states bright red bloody discharge when she wiped and also had small drips of blood.

## 2018-04-24 NOTE — ED Triage Notes (Signed)
Pt reports her last period was Oct 9, had +preg test and appt with OB in Dec. Reports lower abd cramping tonight and vaginal bleeding x 25 mins

## 2018-04-25 ENCOUNTER — Inpatient Hospital Stay (HOSPITAL_COMMUNITY): Payer: Medicaid Other

## 2018-04-25 ENCOUNTER — Telehealth: Payer: Self-pay

## 2018-04-25 ENCOUNTER — Encounter (HOSPITAL_COMMUNITY): Payer: Self-pay | Admitting: Student

## 2018-04-25 DIAGNOSIS — O200 Threatened abortion: Secondary | ICD-10-CM

## 2018-04-25 DIAGNOSIS — O468X1 Other antepartum hemorrhage, first trimester: Secondary | ICD-10-CM

## 2018-04-25 DIAGNOSIS — O418X10 Other specified disorders of amniotic fluid and membranes, first trimester, not applicable or unspecified: Secondary | ICD-10-CM

## 2018-04-25 DIAGNOSIS — Z3A01 Less than 8 weeks gestation of pregnancy: Secondary | ICD-10-CM

## 2018-04-25 DIAGNOSIS — R109 Unspecified abdominal pain: Secondary | ICD-10-CM

## 2018-04-25 DIAGNOSIS — O26891 Other specified pregnancy related conditions, first trimester: Secondary | ICD-10-CM

## 2018-04-25 NOTE — Discharge Instructions (Signed)
Return to care  °· If you have heavier bleeding that soaks through more that 2 pads per hour for an hour or more °· If you bleed so much that you feel like you might pass out or you do pass out °· If you have significant abdominal pain that is not improved with Tylenol  °· If you develop a fever > 100.5 ° ° °Threatened Miscarriage °A threatened miscarriage occurs when you have vaginal bleeding during your first 20 weeks of pregnancy but the pregnancy has not ended. If you have vaginal bleeding during this time, your health care provider will do tests to make sure you are still pregnant. If the tests show you are still pregnant and the developing baby (fetus) inside your womb (uterus) is still growing, your condition is considered a threatened miscarriage. °A threatened miscarriage does not mean your pregnancy will end, but it does increase the risk of losing your pregnancy (complete miscarriage). °What are the causes? °The cause of a threatened miscarriage is usually not known. If you go on to have a complete miscarriage, the most common cause is an abnormal number of chromosomes in the developing baby. Chromosomes are the structures inside cells that hold all your genetic material. °Some causes of vaginal bleeding that do not result in miscarriage include: °· Having sex. °· Having an infection. °· Normal hormone changes of pregnancy. °· Bleeding that occurs when an egg implants in your uterus. ° °What increases the risk? °Risk factors for bleeding in early pregnancy include: °· Obesity. °· Smoking. °· Drinking excessive amounts of alcohol or caffeine. °· Recreational drug use. ° °What are the signs or symptoms? °· Light vaginal bleeding. °· Mild abdominal pain or cramps. °How is this diagnosed? °If you have bleeding with or without abdominal pain before 20 weeks of pregnancy, your health care provider will do tests to check whether you are still pregnant. One important test involves using sound waves and a computer  (ultrasound) to create images of the inside of your uterus. Other tests include an internal exam of your vagina and uterus (pelvic exam) and measurement of your baby’s heart rate. °You may be diagnosed with a threatened miscarriage if: °· Ultrasound testing shows you are still pregnant. °· Your baby’s heart rate is strong. °· A pelvic exam shows that the opening between your uterus and your vagina (cervix) is closed. °· Your heart rate and blood pressure are stable. °· Blood tests confirm you are still pregnant. ° °How is this treated? °No treatments have been shown to prevent a threatened miscarriage from going on to a complete miscarriage. However, the right home care is important. °Follow these instructions at home: °· Make sure you keep all your appointments for prenatal care. This is very important. °· Get plenty of rest. °· Do not have sex or use tampons if you have vaginal bleeding. °· Do not douche. °· Do not smoke or use recreational drugs. °· Do not drink alcohol. °· Avoid caffeine. °Contact a health care provider if: °· You have light vaginal bleeding or spotting while pregnant. °· You have abdominal pain or cramping. °· You have a fever. °Get help right away if: °· You have heavy vaginal bleeding. °· You have blood clots coming from your vagina. °· You have severe low back pain or abdominal cramps. °· You have fever, chills, and severe abdominal pain. °This information is not intended to replace advice given to you by your health care provider. Make sure you discuss any questions   you have with your health care provider. °Document Released: 06/01/2005 Document Revised: 11/07/2015 Document Reviewed: 03/28/2013 °Elsevier Interactive Patient Education © 2018 [employer]vier Inc. ° °

## 2018-04-25 NOTE — Telephone Encounter (Signed)
Dr. Tawni Levy office returned call to [place] and made aware that patient needs follow up HCG. Nurse Iris states she has the report and will call and schedule patient for follow up HCG. Armandina Stammer RN

## 2018-04-25 NOTE — Telephone Encounter (Signed)
Called Dr. Sherilyn Cooter Dorn's office to make nursing staff aware that patient was seen at MAU at [facility] this weekend for pregnancy of unknown location. Called to ensure patient has appointment for HCG on Tuesday 04-26-18.  Left message for Dr. Shawnie Pons Nursing staff to return call to me to ensure they received the message. Armandina Stammer RN

## 2018-04-25 NOTE — MAU Note (Signed)
Left lower abdominal pain that started around 2015-came on sharp.  Then noticed she started having vaginal bleeding.  Did not take anything for the pain at home.  LMP 03/23/18
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
## 2018-04-26 LAB — GC/CHLAMYDIA PROBE AMP (~~LOC~~) NOT AT ARMC
Chlamydia: NEGATIVE
NEISSERIA GONORRHEA: NEGATIVE

## 2018-04-26 LAB — RPR: RPR: NONREACTIVE

## 2018-04-26 LAB — HIV ANTIBODY (ROUTINE TESTING W REFLEX): HIV SCREEN 4TH GENERATION: NONREACTIVE

## 2018-04-29 ENCOUNTER — Telehealth: Payer: Self-pay | Admitting: Obstetrics & Gynecology

## 2018-04-29 NOTE — Telephone Encounter (Signed)
error 

## 2018-12-27 ENCOUNTER — Emergency Department (HOSPITAL_BASED_OUTPATIENT_CLINIC_OR_DEPARTMENT_OTHER)
Admission: EM | Admit: 2018-12-27 | Discharge: 2018-12-27 | Disposition: A | Discharge status: Home / Self Care | Payer: Medicaid Other | Attending: Emergency Medicine | Admitting: Emergency Medicine

## 2018-12-27 ENCOUNTER — Encounter (HOSPITAL_BASED_OUTPATIENT_CLINIC_OR_DEPARTMENT_OTHER): Payer: Self-pay | Admitting: Emergency Medicine

## 2018-12-27 ENCOUNTER — Other Ambulatory Visit: Payer: Self-pay

## 2018-12-27 DIAGNOSIS — K0889 Other specified disorders of teeth and supporting structures: Secondary | ICD-10-CM | POA: Insufficient documentation

## 2018-12-27 MED ORDER — PENICILLIN V POTASSIUM 500 MG PO TABS: 500.0000 mg | ORAL_TABLET | Freq: Four times a day (QID) | ORAL | 0 refills | Status: AC

## 2018-12-27 MED ORDER — BUPIVACAINE-EPINEPHRINE (PF) 0.5% -1:200000 IJ SOLN
10.0000 mL | Freq: Once | INTRAMUSCULAR | Status: AC
  Administered 2018-12-27: 10 mL

## 2018-12-27 MED ORDER — BUPIVACAINE HCL 0.25 % IJ SOLN: 10.0000 mL | Freq: Once | INTRAMUSCULAR | Status: DC

## 2018-12-27 MED ORDER — BUPIVACAINE-EPINEPHRINE (PF) 0.5% -1:200000 IJ SOLN
INTRAMUSCULAR | Status: AC
  Administered 2018-12-27: 10 mL
  Filled 2018-12-27: qty 1.8

## 2018-12-27 MED ORDER — NAPROXEN 500 MG PO TABS: 500.0000 mg | ORAL_TABLET | Freq: Two times a day (BID) | ORAL | 0 refills | Status: DC

## 2018-12-27 MED FILL — PENICILLIN VK 500 MG TABLET: 500 | 7 days supply | Qty: 28 | Fill #0

## 2018-12-27 MED FILL — NAPROXEN 500 MG TABLET: 500 | 15 days supply | Qty: 30 | Fill #0

## 2018-12-27 NOTE — Discharge Instructions (Addendum)
You were evaluated in the Emergency Department and after careful evaluation, we did not find any emergent condition requiring admission or further testing in the hospital.  Your symptoms today seem to be due to a tooth infection.  We provided a pain injection here in the emergency department.  It is still important you take the antibiotics and anti-inflammatories as directed and follow-up with a dentist.  Please return to the Emergency Department if you experience any worsening of your condition.  We encourage you to follow up with a primary care provider.  Thank you for allowing [place] to be a part of your care.

## 2018-12-27 NOTE — ED Triage Notes (Signed)
Left upper tooth pain since last night.  Took OTC meds without relief.  Pt also tried heat without helping.
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
## 2019-10-03 ENCOUNTER — Emergency Department (HOSPITAL_BASED_OUTPATIENT_CLINIC_OR_DEPARTMENT_OTHER)
Admission: EM | Admit: 2019-10-03 | Discharge: 2019-10-03 | Disposition: A | Discharge status: Home / Self Care | Payer: Medicaid Other | Attending: Emergency Medicine | Admitting: Emergency Medicine

## 2019-10-03 ENCOUNTER — Emergency Department (HOSPITAL_BASED_OUTPATIENT_CLINIC_OR_DEPARTMENT_OTHER): Payer: Medicaid Other

## 2019-10-03 ENCOUNTER — Encounter (HOSPITAL_BASED_OUTPATIENT_CLINIC_OR_DEPARTMENT_OTHER): Payer: Self-pay

## 2019-10-03 ENCOUNTER — Other Ambulatory Visit: Payer: Self-pay

## 2019-10-03 DIAGNOSIS — R110 Nausea: Secondary | ICD-10-CM | POA: Diagnosis not present

## 2019-10-03 DIAGNOSIS — R1031 Right lower quadrant pain: Secondary | ICD-10-CM | POA: Diagnosis not present

## 2019-10-03 DIAGNOSIS — Z881 Allergy status to other antibiotic agents status: Secondary | ICD-10-CM | POA: Insufficient documentation

## 2019-10-03 DIAGNOSIS — R109 Unspecified abdominal pain: Secondary | ICD-10-CM

## 2019-10-03 LAB — CBC WITH DIFFERENTIAL/PLATELET
Abs Immature Granulocytes: 0.01 10*3/uL (ref 0.00–0.07)
Basophils Absolute: 0 10*3/uL (ref 0.0–0.1)
Basophils Relative: 1 %
Eosinophils Absolute: 0.1 10*3/uL (ref 0.0–0.5)
Eosinophils Relative: 1 %
HCT: 39.3 % (ref 36.0–46.0)
Hemoglobin: 12.8 g/dL (ref 12.0–15.0)
Immature Granulocytes: 0 %
Lymphocytes Relative: 47 %
Lymphs Abs: 2.7 10*3/uL (ref 0.7–4.0)
MCH: 31.4 pg (ref 26.0–34.0)
MCHC: 32.6 g/dL (ref 30.0–36.0)
MCV: 96.6 fL (ref 80.0–100.0)
Monocytes Absolute: 0.4 10*3/uL (ref 0.1–1.0)
Monocytes Relative: 7 %
Neutro Abs: 2.4 10*3/uL (ref 1.7–7.7)
Neutrophils Relative %: 44 %
Platelets: 234 10*3/uL (ref 150–400)
RBC: 4.07 MIL/uL (ref 3.87–5.11)
RDW: 12.2 % (ref 11.5–15.5)
WBC: 5.5 10*3/uL (ref 4.0–10.5)
nRBC: 0 % (ref 0.0–0.2)

## 2019-10-03 LAB — URINALYSIS, ROUTINE W REFLEX MICROSCOPIC
Bilirubin Urine: NEGATIVE
Glucose, UA: NEGATIVE mg/dL
Ketones, ur: NEGATIVE mg/dL
Leukocytes,Ua: NEGATIVE
Nitrite: NEGATIVE
Protein, ur: NEGATIVE mg/dL
Specific Gravity, Urine: 1.025 (ref 1.005–1.030)
pH: 6 (ref 5.0–8.0)

## 2019-10-03 LAB — URINALYSIS, MICROSCOPIC (REFLEX)

## 2019-10-03 LAB — COMPREHENSIVE METABOLIC PANEL
ALT: 14 U/L (ref 0–44)
AST: 17 U/L (ref 15–41)
Albumin: 4.3 g/dL (ref 3.5–5.0)
Alkaline Phosphatase: 49 U/L (ref 38–126)
Anion gap: 8 (ref 5–15)
BUN: 9 mg/dL (ref 6–20)
CO2: 26 mmol/L (ref 22–32)
Calcium: 9.1 mg/dL (ref 8.9–10.3)
Chloride: 105 mmol/L (ref 98–111)
Creatinine, Ser: 0.81 mg/dL (ref 0.44–1.00)
GFR calc Af Amer: >60 mL/min (ref >60)
GFR calc non Af Amer: >60 mL/min (ref >60)
Glucose, Bld: 89 mg/dL (ref 70–99)
Potassium: 4 mmol/L (ref 3.5–5.1)
Sodium: 139 mmol/L (ref 135–145)
Total Bilirubin: 0.4 mg/dL (ref 0.3–1.2)
Total Protein: 7 g/dL (ref 6.5–8.1)

## 2019-10-03 LAB — PREGNANCY, URINE: Preg Test, Ur: NEGATIVE

## 2019-10-03 LAB — LIPASE, BLOOD: Lipase: 32 U/L (ref 11–51)

## 2019-10-03 MED ORDER — MORPHINE SULFATE (PF) 4 MG/ML IV SOLN
4.0000 mg | Freq: Once | INTRAVENOUS | Status: AC
  Administered 2019-10-03: 4 mg via INTRAVENOUS
  Filled 2019-10-03: qty 1

## 2019-10-03 MED ORDER — ONDANSETRON HCL 4 MG/2ML IJ SOLN
4.0000 mg | Freq: Once | INTRAMUSCULAR | Status: AC
  Administered 2019-10-03: 4 mg via INTRAVENOUS
  Filled 2019-10-03: qty 2

## 2019-10-03 MED ORDER — NAPROXEN 500 MG PO TABS: 500.0000 mg | ORAL_TABLET | Freq: Two times a day (BID) | ORAL | 0 refills | Status: AC

## 2019-10-03 MED ORDER — KETOROLAC TROMETHAMINE 15 MG/ML IJ SOLN
15.0000 mg | Freq: Once | INTRAMUSCULAR | Status: AC
  Administered 2019-10-03: 15 mg via INTRAVENOUS
  Filled 2019-10-03: qty 1

## 2019-10-03 NOTE — ED Notes (Signed)
ED Provider at bedside. 
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
## 2019-10-03 NOTE — ED Notes (Signed)
Pt transported to CT ?

## 2019-10-03 NOTE — Discharge Instructions (Addendum)
You were seen today for right flank right lower quadrant pain.  Your CT scan shows some inflammation of the ureter which can be seen in infection although your urinalysis does not indicate infection.  This will be cultured.  You could have recently passed a kidney stone.  Monitor your symptoms closely at home and take naproxen as needed for pain.

## 2019-10-04 LAB — URINE CULTURE

## 2020-03-30 ENCOUNTER — Emergency Department (HOSPITAL_BASED_OUTPATIENT_CLINIC_OR_DEPARTMENT_OTHER)
Admission: EM | Admit: 2020-03-30 | Discharge: 2020-03-30 | Disposition: A | Discharge status: Home / Self Care | Payer: Medicaid Other | Attending: Emergency Medicine | Admitting: Emergency Medicine

## 2020-03-30 ENCOUNTER — Encounter (HOSPITAL_BASED_OUTPATIENT_CLINIC_OR_DEPARTMENT_OTHER): Payer: Self-pay | Admitting: Emergency Medicine

## 2020-03-30 ENCOUNTER — Other Ambulatory Visit: Payer: Self-pay

## 2020-03-30 DIAGNOSIS — K0889 Other specified disorders of teeth and supporting structures: Secondary | ICD-10-CM | POA: Diagnosis present

## 2020-03-30 DIAGNOSIS — K029 Dental caries, unspecified: Secondary | ICD-10-CM | POA: Diagnosis not present

## 2020-03-30 MED ORDER — LIDOCAINE VISCOUS HCL 2 % MT SOLN
15.0000 mL | Freq: Once | OROMUCOSAL | Status: AC
  Administered 2020-03-30: 15 mL via OROMUCOSAL
  Filled 2020-03-30: qty 15

## 2020-03-30 MED ORDER — HYDROCODONE-ACETAMINOPHEN 5-325 MG PO TABS: 1.0000 | ORAL_TABLET | ORAL | 0 refills | Status: AC | PRN

## 2020-03-30 NOTE — ED Triage Notes (Signed)
Patient arrived via POV c/o dental pain x 1 week. Patient was seen by dentist, given antibiotics and to make an appointment for an extraction. Patient states pain in worse and swelling has increased. Patient is AO x 4, VS WDL, normal gait.
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
## 2020-03-30 NOTE — Discharge Instructions (Addendum)
Continue taking naproxen twice a day.   Continue taking your antibiotic as prescribed.  You may add acetaminophen to get additional pain relief.  Reserve hydrocodone-acetaminophen for severe pain. Do not drive or operate machinery within four hours of taking hydromorphone.  Follow up with your dentist for definitive care.

## 2020-07-22 ENCOUNTER — Emergency Department (HOSPITAL_BASED_OUTPATIENT_CLINIC_OR_DEPARTMENT_OTHER): Payer: Medicaid Other

## 2020-07-22 ENCOUNTER — Encounter (HOSPITAL_BASED_OUTPATIENT_CLINIC_OR_DEPARTMENT_OTHER): Payer: Self-pay | Admitting: *Deleted

## 2020-07-22 ENCOUNTER — Other Ambulatory Visit: Payer: Self-pay

## 2020-07-22 ENCOUNTER — Emergency Department (HOSPITAL_BASED_OUTPATIENT_CLINIC_OR_DEPARTMENT_OTHER)
Admission: EM | Admit: 2020-07-22 | Discharge: 2020-07-22 | Disposition: A | Discharge status: Home / Self Care | Payer: Medicaid Other | Attending: Emergency Medicine | Admitting: Emergency Medicine

## 2020-07-22 DIAGNOSIS — S6991XA Unspecified injury of right wrist, hand and finger(s), initial encounter: Secondary | ICD-10-CM | POA: Diagnosis present

## 2020-07-22 DIAGNOSIS — W231XXA Caught, crushed, jammed, or pinched between stationary objects, initial encounter: Secondary | ICD-10-CM | POA: Diagnosis not present

## 2020-07-22 DIAGNOSIS — S60221A Contusion of right hand, initial encounter: Secondary | ICD-10-CM | POA: Insufficient documentation
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
## 2020-07-22 NOTE — ED Triage Notes (Signed)
Right hand slammed in a door yesterday. Pain across the back of her hand.

## 2020-07-22 NOTE — Discharge Instructions (Addendum)
Please read instructions below. Apply ice to your hand for 20 minutes at a time. You can take 600mg  ibuprofen every 6 hours as needed for pain. Schedule an appointment with your primary care as needed. Return to the ER for new or concerning symptoms.

## 2021-03-25 ENCOUNTER — Other Ambulatory Visit: Payer: Self-pay

## 2021-03-25 ENCOUNTER — Encounter (HOSPITAL_BASED_OUTPATIENT_CLINIC_OR_DEPARTMENT_OTHER): Payer: Self-pay | Admitting: *Deleted

## 2021-03-25 DIAGNOSIS — Y92410 Unspecified street and highway as the place of occurrence of the external cause: Secondary | ICD-10-CM | POA: Diagnosis not present

## 2021-03-25 DIAGNOSIS — S4990XA Unspecified injury of shoulder and upper arm, unspecified arm, initial encounter: Secondary | ICD-10-CM | POA: Insufficient documentation
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
## 2021-03-26 ENCOUNTER — Emergency Department (HOSPITAL_BASED_OUTPATIENT_CLINIC_OR_DEPARTMENT_OTHER)
Admission: EM | Admit: 2021-03-26 | Discharge: 2021-03-26 | Disposition: A | Discharge status: Home / Self Care | Payer: BC Managed Care – PPO | Attending: Emergency Medicine | Admitting: Emergency Medicine

## 2021-03-26 ENCOUNTER — Emergency Department (HOSPITAL_BASED_OUTPATIENT_CLINIC_OR_DEPARTMENT_OTHER): Payer: BC Managed Care – PPO

## 2021-03-26 DIAGNOSIS — S4990XA Unspecified injury of shoulder and upper arm, unspecified arm, initial encounter: Secondary | ICD-10-CM | POA: Diagnosis not present

## 2021-03-26 MED ORDER — NAPROXEN 250 MG PO TABS
500.0000 mg | ORAL_TABLET | Freq: Once | ORAL | Status: AC
  Administered 2021-03-26: 500 mg via ORAL
  Filled 2021-03-26: qty 2

## 2021-03-26 MED ORDER — DICLOFENAC SODIUM ER 100 MG PO TB24: 100.0000 mg | ORAL_TABLET | Freq: Every day | ORAL | 0 refills | Status: AC

## 2021-03-26 MED ORDER — LIDOCAINE 5 % EX PTCH
3.0000 | MEDICATED_PATCH | CUTANEOUS | Status: DC
  Administered 2021-03-26: 3 via TRANSDERMAL
  Filled 2021-03-26: qty 3

## 2021-03-26 MED ORDER — LIDOCAINE 5 % EX PTCH: 1.0000 | MEDICATED_PATCH | CUTANEOUS | 0 refills | Status: AC

## 2021-03-26 MED ORDER — IBUPROFEN 800 MG PO TABS
800.0000 mg | ORAL_TABLET | Freq: Once | ORAL | Status: DC
  Filled 2021-03-26: qty 1

## 2021-03-26 NOTE — ED Notes (Signed)
Pt states is taking 800 mg ibuprofen at home and not providing relief.
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
## 2021-05-13 ENCOUNTER — Emergency Department (HOSPITAL_BASED_OUTPATIENT_CLINIC_OR_DEPARTMENT_OTHER)
Admission: EM | Admit: 2021-05-13 | Discharge: 2021-05-14 | Disposition: A | Discharge status: Home / Self Care | Payer: BC Managed Care – PPO | Attending: Emergency Medicine | Admitting: Emergency Medicine

## 2021-05-13 ENCOUNTER — Other Ambulatory Visit: Payer: Self-pay

## 2021-05-13 ENCOUNTER — Encounter (HOSPITAL_BASED_OUTPATIENT_CLINIC_OR_DEPARTMENT_OTHER): Payer: Self-pay | Admitting: *Deleted

## 2021-05-13 DIAGNOSIS — R0981 Nasal congestion: Secondary | ICD-10-CM | POA: Diagnosis not present

## 2021-05-13 DIAGNOSIS — R519 Headache, unspecified: Secondary | ICD-10-CM | POA: Diagnosis not present

## 2021-05-13 DIAGNOSIS — R059 Cough, unspecified: Secondary | ICD-10-CM | POA: Diagnosis not present

## 2021-05-13 DIAGNOSIS — Z20822 Contact with and (suspected) exposure to covid-19: Secondary | ICD-10-CM | POA: Diagnosis not present

## 2021-05-13 DIAGNOSIS — J069 Acute upper respiratory infection, unspecified: Secondary | ICD-10-CM

## 2021-05-13 LAB — RESP PANEL BY RT-PCR (FLU A&B, COVID) ARPGX2
Influenza A by PCR: NEGATIVE
Influenza B by PCR: NEGATIVE
SARS Coronavirus 2 by RT PCR: NEGATIVE

## 2021-05-13 MED ORDER — ACETAMINOPHEN 500 MG PO TABS
1000.0000 mg | ORAL_TABLET | Freq: Once | ORAL | Status: AC
  Administered 2021-05-14: 1000 mg via ORAL
  Filled 2021-05-13: qty 2

## 2021-05-13 NOTE — ED Triage Notes (Signed)
Headache since last night. [redacted] weeks pregnant. Her daughter has a cough.
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
## 2021-05-14 NOTE — ED Notes (Signed)
Pt. Assessed and treated by EDP accordingly.

## 2021-05-14 NOTE — Discharge Instructions (Signed)
You may take tylenol only for headache

## 2021-06-17 ENCOUNTER — Emergency Department (HOSPITAL_BASED_OUTPATIENT_CLINIC_OR_DEPARTMENT_OTHER)
Admission: EM | Admit: 2021-06-17 | Discharge: 2021-06-17 | Disposition: A | Discharge status: Home / Self Care | Payer: BC Managed Care – PPO | Attending: Emergency Medicine | Admitting: Emergency Medicine

## 2021-06-17 ENCOUNTER — Encounter (HOSPITAL_BASED_OUTPATIENT_CLINIC_OR_DEPARTMENT_OTHER): Payer: Self-pay

## 2021-06-17 ENCOUNTER — Other Ambulatory Visit: Payer: Self-pay

## 2021-06-17 DIAGNOSIS — R519 Headache, unspecified: Secondary | ICD-10-CM | POA: Insufficient documentation

## 2021-06-17 DIAGNOSIS — Z5321 Procedure and treatment not carried out due to patient leaving prior to being seen by health care provider: Secondary | ICD-10-CM | POA: Insufficient documentation
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
